# Patient Record
Sex: Female | Born: 1995 | Race: Black or African American | Hispanic: No | Marital: Single | Smoking: Never smoker
Health system: Southern US, Community
[De-identification: ages and names within clinical notes are randomized; demographics above are authoritative.]

## PROBLEM LIST (undated history)

## (undated) ENCOUNTER — Inpatient Hospital Stay (HOSPITAL_COMMUNITY): Payer: Self-pay

## (undated) DIAGNOSIS — L709 Acne, unspecified: Secondary | ICD-10-CM

## (undated) DIAGNOSIS — B951 Streptococcus, group B, as the cause of diseases classified elsewhere: Secondary | ICD-10-CM

## (undated) DIAGNOSIS — O99519 Diseases of the respiratory system complicating pregnancy, unspecified trimester: Secondary | ICD-10-CM

## (undated) DIAGNOSIS — N760 Acute vaginitis: Secondary | ICD-10-CM

## (undated) DIAGNOSIS — B9689 Other specified bacterial agents as the cause of diseases classified elsewhere: Secondary | ICD-10-CM

## (undated) DIAGNOSIS — O2340 Unspecified infection of urinary tract in pregnancy, unspecified trimester: Secondary | ICD-10-CM

## (undated) DIAGNOSIS — J45909 Unspecified asthma, uncomplicated: Secondary | ICD-10-CM

## (undated) DIAGNOSIS — M1990 Unspecified osteoarthritis, unspecified site: Secondary | ICD-10-CM

## (undated) HISTORY — DX: Acne, unspecified: L70.9

## (undated) HISTORY — DX: Other specified bacterial agents as the cause of diseases classified elsewhere: B96.89

## (undated) HISTORY — DX: Diseases of the respiratory system complicating pregnancy, unspecified trimester: O99.519

## (undated) HISTORY — PX: NO PAST SURGERIES: SHX2092

## (undated) HISTORY — DX: Unspecified infection of urinary tract in pregnancy, unspecified trimester: O23.40

## (undated) HISTORY — DX: Unspecified asthma, uncomplicated: J45.909

## (undated) HISTORY — DX: Acute vaginitis: N76.0

## (undated) HISTORY — DX: Streptococcus, group b, as the cause of diseases classified elsewhere: B95.1

---
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
## 2015-01-08 ENCOUNTER — Encounter (HOSPITAL_COMMUNITY): Payer: Self-pay | Admitting: Emergency Medicine

## 2015-01-08 ENCOUNTER — Emergency Department (HOSPITAL_COMMUNITY)
Admission: EM | Admit: 2015-01-08 | Discharge: 2015-01-08 | Disposition: A | Discharge status: Home / Self Care | Payer: 59 | Attending: Emergency Medicine | Admitting: Emergency Medicine

## 2015-01-08 DIAGNOSIS — R05 Cough: Secondary | ICD-10-CM | POA: Insufficient documentation

## 2015-01-08 DIAGNOSIS — R0789 Other chest pain: Secondary | ICD-10-CM | POA: Insufficient documentation

## 2015-01-08 DIAGNOSIS — Z79899 Other long term (current) drug therapy: Secondary | ICD-10-CM | POA: Insufficient documentation

## 2015-01-08 DIAGNOSIS — M546 Pain in thoracic spine: Secondary | ICD-10-CM | POA: Diagnosis not present

## 2015-01-08 DIAGNOSIS — J45909 Unspecified asthma, uncomplicated: Secondary | ICD-10-CM | POA: Insufficient documentation

## 2015-01-08 DIAGNOSIS — R059 Cough, unspecified: Secondary | ICD-10-CM

## 2015-01-08 MED ORDER — ALBUTEROL SULFATE (2.5 MG/3ML) 0.083% IN NEBU
5.0000 mg | INHALATION_SOLUTION | Freq: Once | RESPIRATORY_TRACT | Status: AC
  Administered 2015-01-08: 5 mg via RESPIRATORY_TRACT
  Filled 2015-01-08: qty 6

## 2015-01-08 MED ORDER — NAPROXEN 250 MG PO TABS
500.0000 mg | ORAL_TABLET | Freq: Once | ORAL | Status: AC
  Administered 2015-01-08: 500 mg via ORAL
  Filled 2015-01-08: qty 2

## 2015-01-08 MED ORDER — BENZONATATE 100 MG PO CAPS: 100.0000 mg | ORAL_CAPSULE | Freq: Three times a day (TID) | ORAL | Status: DC

## 2015-01-08 NOTE — ED Notes (Signed)
Pt. reports productive cough with chest congestion/tightness for 4 days unrelieved by OTC cough medications , denies fever /respirations unlabored.

## 2015-01-08 NOTE — Discharge Instructions (Signed)
Please read and follow all provided instructions.  Your diagnoses today include:  1. Cough     Tests performed today include:  Vital signs. See below for your results today.   Medications prescribed:   Tessalon Perles - cough suppressant medication  Take any prescribed medications only as directed.  Home care instructions:  Follow any educational materials contained in this packet.  Follow-up instructions: Please follow-up with your primary care provider in the next 3 days for further evaluation of your symptoms and a recheck if you are not feeling better.   Return instructions:   Please return to the Emergency Department if you experience worsening symptoms.  Please return with worsening wheezing, shortness of breath, or difficulty breathing.  Return with persistent fever above 101F.   Please return if you have any other emergent concerns.  Additional Information:  Your vital signs today were: BP 97/82 mmHg   Pulse 106   Temp(Src) 98.7 F (37.1 C) (Oral)   Resp 16   Ht 5\' 2"  (1.575 m)   Wt 48.988 kg   BMI 19.75 kg/m2   SpO2 97%   LMP 12/08/2014 (Approximate) If your blood pressure (BP) was elevated above 135/85 this visit, please have this repeated by your doctor within one month. --------------

## 2015-01-08 NOTE — ED Provider Notes (Signed)
CSN: [id number]     Arrival date & time 01/08/15  2028 History  By signing my name below, I, Elaine Stewart, attest that this documentation has been prepared under the direction and in the presence of non-physician practitioner, Rhea BleacherJosh Marquese Burkland PA-C. Electronically Signed: Freida Busmaniana Stewart, Scribe. 01/08/2015. 9:54 PM.     Chief Complaint  Patient presents with  . Cough   The history is provided by the patient. No language interpreter was used.     HPI Comments:  Elaine Stewart is a 20 y.o. female who presents to the Emergency Department complaining of persistent cough x 4 days that worsened yesterday with green sputum production. She reports associated chest tightness and diffuse chest and upper back pain. She denies fever and wheezing. Pt reports h/o asthma as a child; no exacerbations as an adult. She received albuterol for sx during pregnancy however. No alleviating factors noted. Patient denies risk factors for pulmonary embolism including: unilateral leg swelling, history of DVT/PE/other blood clots, use of exogenous hormones, recent immobilizations, recent surgery, recent travel (>4hr segment), malignancy, hemoptysis.      Past Medical History  Diagnosis Date  . Arthritis    Past Surgical History  Procedure Laterality Date  . No past surgeries     No family history on file. Social History  Substance Use Topics  . Smoking status: Never Smoker   . Smokeless tobacco: None  . Alcohol Use: No   OB History    Gravida Para Term Preterm AB TAB SAB Ectopic Multiple Living   1              Review of Systems  Constitutional: Negative for fever, chills and fatigue.  HENT: Negative for congestion, ear pain, rhinorrhea, sinus pressure and sore throat.   Eyes: Negative for redness.  Respiratory: Positive for cough and chest tightness. Negative for wheezing.   Cardiovascular: Positive for chest pain.  Gastrointestinal: Negative for nausea, vomiting, abdominal pain and diarrhea.  Genitourinary:  Negative for dysuria.  Musculoskeletal: Positive for back pain. Negative for myalgias and neck stiffness.  Skin: Negative for rash.  Neurological: Negative for headaches.  Hematological: Negative for adenopathy.   Allergies  Review of patient's allergies indicates no known allergies.  Home Medications   Prior to Admission medications   Medication Sig Start Date End Date Taking? Authorizing Provider  cephALEXin (KEFLEX) 500 MG capsule Take 1 capsule (500 mg total) by mouth 4 (four) times daily. Patient not taking: Reported on 07/04/2014 01/29/14   Delbert PhenixLinda M Barefoot, NP  fluconazole (DIFLUCAN) 150 MG tablet Take 1 tablet (150 mg total) by mouth once. Patient not taking: Reported on 07/04/2014 02/05/14   Adam PhenixJames G Arnold, MD  Prenatal Vit-Fe Fumarate-FA (PRENATAL MULTIVITAMIN) TABS tablet Take 1 tablet by mouth daily at 12 noon.    Historical Provider, MD   BP 97/82 mmHg  Pulse 106  Temp(Src) 98.7 F (37.1 C) (Oral)  Resp 16  Ht 5\' 2"  (1.575 m)  Wt 108 lb (48.988 kg)  BMI 19.75 kg/m2  SpO2 97%  LMP 12/08/2014 (Approximate)   Physical Exam  Constitutional: She appears well-developed and well-nourished. No distress.  HENT:  Head: Normocephalic and atraumatic.  Right Ear: Tympanic membrane, external ear and ear canal normal.  Left Ear: Tympanic membrane, external ear and ear canal normal.  Nose: Nose normal. No mucosal edema or rhinorrhea.  Mouth/Throat: Uvula is midline, oropharynx is clear and moist and mucous membranes are normal. Mucous membranes are not dry. No oral lesions. No trismus in  the jaw. No uvula swelling. No oropharyngeal exudate, posterior oropharyngeal edema, posterior oropharyngeal erythema or tonsillar abscesses.  Eyes: Conjunctivae are normal. Right eye exhibits no discharge. Left eye exhibits no discharge.  Neck: Normal range of motion. Neck supple.  Cardiovascular: Normal rate, regular rhythm and normal heart sounds.   Pulmonary/Chest: Effort normal and breath sounds  normal. No respiratory distress. She has no wheezes. She has no rales.  Frequent coughing during exam.  Abdominal: Soft. She exhibits no distension. There is no tenderness.  Lymphadenopathy:    She has no cervical adenopathy.  Neurological: She is alert.  Skin: Skin is warm and dry.  Psychiatric: She has a normal mood and affect.  Nursing note and vitals reviewed.   ED Course  Procedures   DIAGNOSTIC STUDIES:  Oxygen Saturation is 97% on RA, normal by my interpretation.    COORDINATION OF CARE:  9:19 PM Will order breathing treatment to assess effectiveness of albuterol. Discussed treatment plan with pt at bedside and pt agreed to plan.  Vital signs reviewed and are as follows: Filed Vitals:   01/08/15 2040  BP: 97/82  Pulse: 106  Temp: 98.7 F (37.1 C)  Resp: 16   10:24 PM Patient without much subjective improvement after breathing treatment, however she is in no distress. Naproxen given for generalized body pain. Will d/c to home with tessalon.   Encouraged to rest and drink plenty of fluids.  Patient told to return to ED or see their primary doctor if their symptoms worsen, high fever not controlled, persistent vomiting, worsening pain, shortness of breath, trouble breathing, increased work of breathing.  Patient verbalized understanding and agreed with plan.     MDM   Final diagnoses:  Cough   Patient with cough. She has generalized chest pain consistent with muscle soreness from frequent coughing. No concerning risk factors or history limits concerning for pneumonia or PE. Do not feel that evaluation with chest x-ray or lab work is indicated at this time. Patient is not hypoxic and is in no distress. Discharge to home with conservative measures, return instructions as above.  I personally performed the services described in this documentation, which was scribed in my presence. The recorded information has been reviewed and is accurate.    Renne Crigler,  PA-C 01/08/15 2229  Zadie Rhine, MD 01/09/15 1311

## 2015-07-27 ENCOUNTER — Inpatient Hospital Stay (HOSPITAL_COMMUNITY)
Admission: AD | Admit: 2015-07-27 | Discharge: 2015-07-27 | Disposition: A | Discharge status: Home / Self Care | Payer: 59 | Source: Ambulatory Visit | Attending: Family Medicine | Admitting: Family Medicine

## 2015-07-27 DIAGNOSIS — M25511 Pain in right shoulder: Secondary | ICD-10-CM | POA: Insufficient documentation

## 2015-07-27 NOTE — Discharge Instructions (Signed)
Heat Therapy °Heat therapy can help ease sore, stiff, injured, and tight muscles and joints. Heat relaxes your muscles, which may help ease your pain.  °RISKS AND COMPLICATIONS °If you have any of the following conditions, do not use heat therapy unless your health care provider has approved: °· Poor circulation. °· Healing wounds or scarred skin in the area being treated. °· Diabetes, heart disease, or high blood pressure. °· Not being able to feel (numbness) the area being treated. °· Unusual swelling of the area being treated. °· Active infections. °· Blood clots. °· Cancer. °· Inability to communicate pain. This may include young children and people who have problems with their brain function (dementia). °· Pregnancy. °Heat therapy should only be used on old, pre-existing, or long-lasting (chronic) injuries. Do not use heat therapy on new injuries unless directed by your health care provider. °HOW TO USE HEAT THERAPY °There are several different kinds of heat therapy, including: °· Moist heat pack. °· Warm water bath. °· Hot water bottle. °· Electric heating pad. °· Heated gel pack. °· Heated wrap. °· Electric heating pad. °Use the heat therapy method suggested by your health care provider. Follow your health care provider's instructions on when and how to use heat therapy. °GENERAL HEAT THERAPY RECOMMENDATIONS °· Do not sleep while using heat therapy. Only use heat therapy while you are awake. °· Your skin may turn pink while using heat therapy. Do not use heat therapy if your skin turns red. °· Do not use heat therapy if you have new pain. °· High heat or long exposure to heat can cause burns. Be careful when using heat therapy to avoid burning your skin. °· Do not use heat therapy on areas of your skin that are already irritated, such as with a rash or sunburn. °SEEK MEDICAL CARE IF: °· You have blisters, redness, swelling, or numbness. °· You have new pain. °· Your pain is worse. °MAKE SURE  YOU: °· Understand these instructions. °· Will watch your condition. °· Will get help right away if you are not doing well or get worse. °  °This information is not intended to replace advice given to you by your health care provider. Make sure you discuss any questions you have with your health care provider. °  °Document Released: 03/14/2011 Document Revised: 01/10/2014 Document Reviewed: 02/12/2013 °Elsevier Interactive Patient Education ©2016 [employer] ° °Joint Pain °Joint pain, which is also called arthralgia, can be caused by many things. Joint pain often goes away when you follow your health care provider's instructions for relieving pain at home. However, joint pain can also be caused by conditions that require further treatment. Common causes of joint pain include: °· Bruising in the area of the joint. °· Overuse of the joint. °· Wear and tear on the joints that occur with aging (osteoarthritis). °· Various other forms of arthritis. °· A buildup of a crystal form of uric acid in the joint (gout). °· Infections of the joint (septic arthritis) or of the bone (osteomyelitis). °Your health care provider may recommend medicine to help with the pain. If your joint pain continues, additional tests may be needed to diagnose your condition. °HOME CARE INSTRUCTIONS °Watch your condition for any changes. Follow these instructions as directed to lessen the pain that you are feeling. °· Take medicines only as directed by your health care provider. °· Rest the affected area for as long as your health care provider says that you should. If directed to do so, raise   the painful joint above the level of your heart while you are sitting or lying down. °· Do not do things that cause or worsen pain. °· If directed, apply ice to the painful area: °¨ Put ice in a plastic bag. °¨ Place a towel between your skin and the bag. °¨ Leave the ice on for 20 minutes, 2-3 times per day. °· Wear an elastic bandage, splint, or sling as  directed by your health care provider. Loosen the elastic bandage or splint if your fingers or toes become numb and tingle, or if they turn cold and blue. °· Begin exercising or stretching the affected area as directed by your health care provider. Ask your health care provider what types of exercise are safe for you. °· Keep all follow-up visits as directed by your health care provider. This is important. °SEEK MEDICAL CARE IF: °· Your pain increases, and medicine does not help. °· Your joint pain does not improve within 3 days. °· You have increased bruising or swelling. °· You have a fever. °· You lose 10 lb (4.5 kg) or more without trying. °SEEK IMMEDIATE MEDICAL CARE IF: °· You are not able to move the joint. °· Your fingers or toes become numb or they turn cold and blue. °  °This information is not intended to replace advice given to you by your health care provider. Make sure you discuss any questions you have with your health care provider. °  °Document Released: 12/20/2004 Document Revised: 01/10/2014 Document Reviewed: 10/01/2013 °Elsevier Interactive Patient Education ©2016 [employer] ° °

## 2015-07-27 NOTE — MAU Note (Signed)
Pt reports she dislocated her right shoulder yesterday and she thinks she got it back in place by herself and reports it has hurt since then and it happened again today. Has not been evaluated for this.

## 2015-07-27 NOTE — MAU Provider Note (Signed)
S:  [name] is a 20 y.o. non- pregnant female here with a possible dislocated right shoulder. Yesterday she heard a pop and she was able to get it back into place, she felt the same pop again today. Her right shoulder is not currently dislocated, however she is worried that it will pop out again. No known recent injury to this area. She came to [facility] for this problem due to wait times of other ED's in the area.   She has tried ice and ibuprofen with only minimal relief.    O:  Vitals:   07/27/15 1931  BP: 109/68  Pulse: 94  Resp: 16  Temp: 98.9 F (37.2 C)  TempSrc: Oral  SpO2: 100%    GENERAL: Well-developed, well-nourished female in no acute distress.  LUNGS: Effort normal SKIN: Warm, dry and without erythema PSYCH: Normal mood and affect MUSCULOSKELETAL: limited range of motion due to pain with right arm and shoulder movement. Patient able to supinate and pronate right hand.  Dislocation not noted at this time. Popping sound not noted with movement.    A:  1. Pain in joint of right shoulder     P:  Discharge in stable condition Discussed the importance of establishing a PCP for further investigation of possible non-emergent shoulder injury. Patient may need outpatient MRI/X-ray. Discussed proper use of MAU. Offer the patient flexeril- patient declined. Recommended heat/ice to affected area.  Patient plans to call PCP in the AM.  Patient encouraged to go to [facility] ED, [facility] ED or urgent care if symptoms worsen  Duane Lope, NP 07/27/2015 11:29 PM

## 2015-07-28 ENCOUNTER — Ambulatory Visit (INDEPENDENT_AMBULATORY_CARE_PROVIDER_SITE_OTHER): Payer: 59

## 2015-07-28 ENCOUNTER — Encounter: Payer: Self-pay | Admitting: Physician Assistant

## 2015-07-28 ENCOUNTER — Ambulatory Visit (INDEPENDENT_AMBULATORY_CARE_PROVIDER_SITE_OTHER): Payer: 59 | Admitting: Physician Assistant

## 2015-07-28 VITALS — BP 118/74 | HR 100 | Temp 98.5°F | Resp 18 | Ht 63.0 in | Wt 108.0 lb

## 2015-07-28 DIAGNOSIS — S43004A Unspecified dislocation of right shoulder joint, initial encounter: Secondary | ICD-10-CM | POA: Diagnosis not present

## 2015-07-28 DIAGNOSIS — M25511 Pain in right shoulder: Secondary | ICD-10-CM

## 2015-07-28 MED ORDER — CYCLOBENZAPRINE HCL 10 MG PO TABS: 10.0000 mg | ORAL_TABLET | Freq: Three times a day (TID) | ORAL | 0 refills | Status: DC | PRN

## 2015-07-28 MED ORDER — MELOXICAM 15 MG PO TABS: 15.0000 mg | ORAL_TABLET | Freq: Every day | ORAL | 1 refills | Status: DC

## 2015-07-28 NOTE — Patient Instructions (Addendum)
The muscle relaxer can cause drowsiness. Please use it with caution-possibly reserving it for bedtime.    IF you received an x-ray today, you will receive an invoice from [facility]. Please contact [facility] at [phone] with questions or concerns regarding your invoice.   IF you received labwork today, you will receive an invoice from [employer]. Please contact [facility] at [phone] with questions or concerns regarding your invoice.   Our billing staff will not be able to assist you with questions regarding bills from these companies.  You will be contacted with the lab results as soon as they are available. The fastest way to get your results is to activate your My Chart account. Instructions are located on the last page of this paperwork. If you have not heard from [place] regarding the results in 2 weeks, please contact this office.
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
## 2015-08-15 ENCOUNTER — Ambulatory Visit (HOSPITAL_COMMUNITY)
Admission: EM | Admit: 2015-08-15 | Discharge: 2015-08-15 | Disposition: A | Discharge status: Nursing Home (Includes ICF) | Payer: 59 | Attending: Family Medicine | Admitting: Family Medicine

## 2015-08-15 ENCOUNTER — Emergency Department (HOSPITAL_COMMUNITY)
Admission: EM | Admit: 2015-08-15 | Discharge: 2015-08-16 | Disposition: A | Discharge status: Home / Self Care | Payer: 59 | Attending: Emergency Medicine | Admitting: Emergency Medicine

## 2015-08-15 ENCOUNTER — Encounter (HOSPITAL_COMMUNITY): Payer: Self-pay

## 2015-08-15 DIAGNOSIS — M6200 Separation of muscle (nontraumatic), unspecified site: Secondary | ICD-10-CM | POA: Diagnosis not present

## 2015-08-15 DIAGNOSIS — R1030 Lower abdominal pain, unspecified: Secondary | ICD-10-CM

## 2015-08-15 DIAGNOSIS — J069 Acute upper respiratory infection, unspecified: Secondary | ICD-10-CM

## 2015-08-15 DIAGNOSIS — J45909 Unspecified asthma, uncomplicated: Secondary | ICD-10-CM | POA: Insufficient documentation

## 2015-08-15 DIAGNOSIS — R1084 Generalized abdominal pain: Secondary | ICD-10-CM | POA: Diagnosis not present

## 2015-08-15 DIAGNOSIS — B349 Viral infection, unspecified: Secondary | ICD-10-CM | POA: Insufficient documentation

## 2015-08-15 DIAGNOSIS — M6208 Separation of muscle (nontraumatic), other site: Secondary | ICD-10-CM

## 2015-08-15 DIAGNOSIS — R109 Unspecified abdominal pain: Secondary | ICD-10-CM | POA: Diagnosis present

## 2015-08-15 LAB — URINALYSIS, ROUTINE W REFLEX MICROSCOPIC
BILIRUBIN URINE: NEGATIVE
Glucose, UA: NEGATIVE mg/dL
HGB URINE DIPSTICK: NEGATIVE
KETONES UR: 15 mg/dL — AB
Leukocytes, UA: NEGATIVE
Nitrite: NEGATIVE
PH: 6 (ref 5.0–8.0)
Protein, ur: NEGATIVE mg/dL
Specific Gravity, Urine: 1.029 (ref 1.005–1.030)

## 2015-08-15 LAB — COMPREHENSIVE METABOLIC PANEL
ALBUMIN: 4 g/dL (ref 3.5–5.0)
ALT: 13 U/L — ABNORMAL LOW (ref 14–54)
AST: 18 U/L (ref 15–41)
Alkaline Phosphatase: 55 U/L (ref 38–126)
Anion gap: 9 (ref 5–15)
BUN: 9 mg/dL (ref 6–20)
CO2: 25 mmol/L (ref 22–32)
Calcium: 8.9 mg/dL (ref 8.9–10.3)
Chloride: 102 mmol/L (ref 101–111)
Creatinine, Ser: 0.71 mg/dL (ref 0.44–1.00)
GFR calc Af Amer: >60 mL/min (ref >60)
GFR calc non Af Amer: >60 mL/min (ref >60)
GLUCOSE: 118 mg/dL — AB (ref 65–99)
POTASSIUM: 3.7 mmol/L (ref 3.5–5.1)
Sodium: 136 mmol/L (ref 135–145)
Total Bilirubin: 0.1 mg/dL — ABNORMAL LOW (ref 0.3–1.2)
Total Protein: 7.2 g/dL (ref 6.5–8.1)

## 2015-08-15 LAB — CBC
HEMATOCRIT: 39.3 % (ref 36.0–46.0)
Hemoglobin: 13.2 g/dL (ref 12.0–15.0)
MCH: 29.2 pg (ref 26.0–34.0)
MCHC: 33.6 g/dL (ref 30.0–36.0)
MCV: 86.9 fL (ref 78.0–100.0)
Platelets: 342 10*3/uL (ref 150–400)
RBC: 4.52 MIL/uL (ref 3.87–5.11)
RDW: 12.9 % (ref 11.5–15.5)
WBC: 4.7 10*3/uL (ref 4.0–10.5)

## 2015-08-15 LAB — LIPASE, BLOOD: LIPASE: 36 U/L (ref 11–51)

## 2015-08-15 LAB — I-STAT BETA HCG BLOOD, ED (MC, WL, AP ONLY): I-stat hCG, quantitative: <5 m[IU]/mL (ref <5)

## 2015-08-15 LAB — POCT RAPID STREP A: Streptococcus, Group A Screen (Direct): NEGATIVE

## 2015-08-15 NOTE — ED Provider Notes (Signed)
[facility]    CSN: [id number] Arrival date & time: 08/15/15  1746  First Provider Contact:  First MD Initiated Contact with Patient 08/15/15 2012        History   Chief Complaint Chief Complaint  Patient presents with  . Nasal Congestion  . Sore Throat  . Abdominal Pain    HPI Elaine Stewart is a 20 y.o. female.   HPI Patient presents this evening with the above complaint.  States that sore throat, nasal congestion started a few days ago.  Has had a cough but nonproductive.  Denies fever, but admits some chills.  Has a one year old son that was sick recently but is getting better.  He does attend a daycare.  abd pain started today and localizes pain to the left lower quadrant to the umbilical area. States that pain is worsening and more comfortable lying in the fetal position. No change in bowel habits.  Denies dysuria.  No nausea, vomiting.   Past Medical History:  Diagnosis Date  . Acne 09/27/2010  . Arthritis   . Asthma affecting pregnancy, antepartum 05/07/2014  . BV (bacterial vaginosis) 01/13/2014  . GBS (group B streptococcus) UTI complicating pregnancy 01/13/2014    Patient Active Problem List   Diagnosis Date Noted  . 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 Rhinitis, allergic 09/29/2010  . Hypertrophy of tonsils and adenoids 09/27/2010    Past Surgical History:  Procedure Laterality Date  . NO PAST SURGERIES      OB History    Gravida Para Term Preterm AB Living   1             SAB TAB Ectopic Multiple Live Births                   Home Medications    Prior to Admission medications   Medication Sig Start Date End Date Taking? Authorizing Provider  cyclobenzaprine (FLEXERIL) 10 MG tablet Take 1 tablet (10 mg total) by mouth 3 (three) times daily as needed for muscle spasms. 07/28/15   Chelle Jeffery, PA-C  meloxicam (MOBIC) 15 MG tablet Take 1 tablet (15 mg total) by mouth daily. 07/28/15   Porfirio Oarhelle Jeffery, PA-C     Family History History reviewed. No pertinent family history.  Social History Social History  Substance Use Topics  . Smoking status: Never Smoker  . Smokeless tobacco: Never Used  . Alcohol use No     Allergies   Review of patient's allergies indicates no known allergies.   Review of Systems Review of Systems  Constitutional: Negative.   HENT: Positive for congestion, postnasal drip, sinus pressure and sore throat. Negative for facial swelling, hearing loss and mouth sores.   Respiratory: Positive for cough and shortness of breath.   Cardiovascular: Negative.   Gastrointestinal: Positive for abdominal pain. Negative for anal bleeding, blood in stool, constipation, diarrhea, nausea and vomiting.  Genitourinary: Negative.   Musculoskeletal: Negative.   Neurological: Negative.   Psychiatric/Behavioral: Negative.      Physical Exam Triage Vital Signs ED Triage Vitals [08/15/15 1912]  Enc Vitals Group     BP 119/75     Pulse Rate 90     Resp      Temp 98.2 F (36.8 C)     Temp Source Oral     SpO2 100 %     Weight      Height      Head Circumference  Peak Flow      Pain Score 10     Pain Loc      Pain Edu?      Excl. in GC?    No data found.   Updated Vital Signs BP 119/75 (BP Location: Left Arm)   Pulse 90   Temp 98.2 F (36.8 C) (Oral)   LMP 07/30/2015 (Within Days)   SpO2 100%   Visual Acuity Right Eye Distance:   Left Eye Distance:   Bilateral Distance:    Right Eye Near:   Left Eye Near:    Bilateral Near:     Physical Exam  Constitutional: She is oriented to person, place, and time. She appears well-developed. She appears distressed (appears to be somewhat uncomfortable due to abd discomfort. ).  HENT:  Head: Normocephalic and atraumatic.  Nose: Nose normal.  Eyes: EOM are normal. Pupils are equal, round, and reactive to light.  Neck: Normal range of motion.  Cardiovascular: Normal rate.   Pulmonary/Chest: Effort normal and  breath sounds normal. No respiratory distress.  Abdominal: Bowel sounds are normal. There is tenderness. There is guarding. A hernia (question umbilical ) is present.  Musculoskeletal: Normal range of motion.  Neurological: She is alert and oriented to person, place, and time.  Skin: Skin is warm and dry.  Psychiatric: She has a normal mood and affect.     UC Treatments / Results  Labs (all labs ordered are listed, but only abnormal results are displayed) Labs Reviewed  POCT RAPID STREP A    EKG  EKG Interpretation None       Radiology No results found.  Procedures Procedures (including critical care time)  Medications Ordered in UC Medications - No data to display   Initial Impression / Assessment and Plan / UC Course  I have reviewed the triage vital signs and the nursing notes.  Pertinent labs & imaging results that were available during my care of the patient were reviewed by me and considered in my medical decision making (see chart for details).  Clinical Course      Final Clinical Impressions(s) / UC Diagnoses   Final diagnoses:  URI (upper respiratory infection)  Lower abdominal pain    New Prescriptions New Prescriptions   No medications on file  with the abd pain that patient is having we recommend that she be evaluated in the ED to see if any imaging studies are needed.  Discussed plan with patient and she voices understanding.    Naida Sleight, PA-C 08/15/15 2205

## 2015-08-15 NOTE — ED Triage Notes (Signed)
[facility] of nasal congestion, sore throat, and abdominal pain x4 days, pt has taken robitussin to treat symptoms, pt states symptoms have worsen No acute distress

## 2015-08-15 NOTE — ED Triage Notes (Signed)
Patient sent from [facility] for further evaluation of left sided umbilical pain. MD concerned that patient may have hernia. Tender to palpation. No trauma, no nausea

## 2015-08-15 NOTE — ED Notes (Signed)
Report given to First Nurse Thayer Ohmhris

## 2015-08-15 NOTE — ED Notes (Signed)
Called x1. No answer.

## 2015-08-16 ENCOUNTER — Emergency Department (HOSPITAL_COMMUNITY): Payer: 59

## 2015-08-16 ENCOUNTER — Encounter (HOSPITAL_COMMUNITY): Payer: Self-pay | Admitting: Radiology

## 2015-08-16 MED ORDER — PSEUDOEPHEDRINE HCL 60 MG PO TABS: 60.0000 mg | ORAL_TABLET | ORAL | 0 refills | Status: DC | PRN

## 2015-08-16 MED ORDER — NAPROXEN 500 MG PO TABS: 500.0000 mg | ORAL_TABLET | Freq: Two times a day (BID) | ORAL | 0 refills | Status: DC

## 2015-08-16 MED ORDER — BENZONATATE 100 MG PO CAPS: 100.0000 mg | ORAL_CAPSULE | Freq: Three times a day (TID) | ORAL | 0 refills | Status: DC

## 2015-08-16 MED ORDER — IOPAMIDOL (ISOVUE-300) INJECTION 61%
INTRAVENOUS | Status: AC
  Administered 2015-08-16: 100 mL
  Filled 2015-08-16: qty 100

## 2015-08-16 MED ORDER — PROMETHAZINE HCL 25 MG/ML IJ SOLN
12.5000 mg | Freq: Once | INTRAMUSCULAR | Status: AC
  Administered 2015-08-16: 12.5 mg via INTRAVENOUS
  Filled 2015-08-16: qty 1

## 2015-08-16 MED ORDER — ONDANSETRON HCL 4 MG/2ML IJ SOLN
INTRAMUSCULAR | Status: AC
  Filled 2015-08-16: qty 2

## 2015-08-16 MED ORDER — DIATRIZOATE MEGLUMINE & SODIUM 66-10 % PO SOLN
ORAL | Status: AC
  Filled 2015-08-16: qty 30

## 2015-08-16 NOTE — Discharge Instructions (Signed)
Please read and follow all provided instructions.  Your diagnoses today include:  1. Generalized abdominal pain   2. Viral syndrome   3. Diastasis recti     Tests performed today include:  Blood counts and electrolytes  Blood tests to check liver and kidney function  Blood tests to check pancreas function  Urine test to look for infection and pregnancy (in women)  CT scan - does not show any hernias or other problems  Vital signs. See below for your results today.   Medications prescribed:   Naproxen - anti-inflammatory pain medication  Do not exceed 500mg  naproxen every 12 hours, take with food  You have been prescribed an anti-inflammatory medication or NSAID. Take with food. Take smallest effective dose for the shortest duration needed for your pain. Stop taking if you experience stomach pain or vomiting.    Tessalon Perles - cough suppressant medication   Pseudoephedrine - decongestant medication to help with nasal congestion  Take any prescribed medications only as directed.  Home care instructions:   Follow any educational materials contained in this packet.  Follow-up instructions: Please follow-up with your primary care provider in the next 3 days for further evaluation of your symptoms.    Return instructions:  SEEK IMMEDIATE MEDICAL ATTENTION IF:  The pain does not go away or becomes severe   A temperature above 101F develops   Repeated vomiting occurs (multiple episodes)   The pain becomes localized to portions of the abdomen. The right side could possibly be appendicitis. In an adult, the left lower portion of the abdomen could be colitis or diverticulitis.   Blood is being passed in stools or vomit (bright red or black tarry stools)   You develop chest pain, difficulty breathing, dizziness or fainting, or become confused, poorly responsive, or inconsolable (young children)  If you have any other emergent concerns regarding your  health  Additional Information: Abdominal (belly) pain can be caused by many things. Your caregiver performed an examination and possibly ordered blood/urine tests and imaging (CT scan, x-rays, ultrasound). Many cases can be observed and treated at home after initial evaluation in the emergency department. Even though you are being discharged home, abdominal pain can be unpredictable. Therefore, you need a repeated exam if your pain does not resolve, returns, or worsens. Most patients with abdominal pain don't have to be admitted to the hospital or have surgery, but serious problems like appendicitis and gallbladder attacks can start out as nonspecific pain. Many abdominal conditions cannot be diagnosed in one visit, so follow-up evaluations are very important.  Your vital signs today were: BP 100/63    Pulse 74    Temp 98.1 F (36.7 C) (Oral)    Resp 16    Ht 5\' 2"  (1.575 m)    Wt 51 kg    LMP 07/30/2015 (Within Days)    SpO2 99%    BMI 20.55 kg/m  If your blood pressure (bp) was elevated above 135/85 this visit, please have this repeated by your doctor within one month. --------------

## 2015-08-16 NOTE — ED Provider Notes (Signed)
[facility] Provider Note   CSN: [id number] Arrival date & time: 08/15/15  2106       History   Chief Complaint Chief Complaint  Patient presents with  . Abdominal Pain    HPI Elaine Stewart is a 20 y.o. female.  Patient with questionable history of ventral hernia -- sent from [facility] with abdominal pain. Patient has had intermittent L sided abd pain starting today. Pain does not radiate. It is worse with palpation. No N/V/D. No urinary or vaginal complaints. No treatments PTA. She states she had trouble with a 'golfball sized' area on abdomen after she had her baby.   Went to [facility] originally for sore throat (neg rapid strep), cough, nasal congestion.       Past Medical History:  Diagnosis Date  . Acne 09/27/2010  . Arthritis   . Asthma affecting pregnancy, antepartum 05/07/2014  . BV (bacterial vaginosis) 01/13/2014  . GBS (group B streptococcus) UTI complicating pregnancy 01/13/2014    Patient Active Problem List   Diagnosis Date Noted  . 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 Rhinitis, allergic 09/29/2010  . Hypertrophy of tonsils and adenoids 09/27/2010    Past Surgical History:  Procedure Laterality Date  . NO PAST SURGERIES      OB History    Gravida Para Term Preterm AB Living   1             SAB TAB Ectopic Multiple Live Births                   Home Medications    Prior to Admission medications   Medication Sig Start Date End Date Taking? Authorizing Provider  cyclobenzaprine (FLEXERIL) 10 MG tablet Take 1 tablet (10 mg total) by mouth 3 (three) times daily as needed for muscle spasms. 07/28/15   Chelle Jeffery, PA-C  meloxicam (MOBIC) 15 MG tablet Take 1 tablet (15 mg total) by mouth daily. 07/28/15   Porfirio Oar, PA-C    Family History No family history on file.  Social History Social History  Substance Use Topics  . Smoking status: Never Smoker  . Smokeless tobacco: Never Used  . Alcohol use No      Allergies   Review of patient's allergies indicates no known allergies.   Review of Systems Review of Systems  Constitutional: Negative for fever.  HENT: Negative for rhinorrhea and sore throat.   Eyes: Negative for redness.  Respiratory: Negative for cough.   Cardiovascular: Negative for chest pain.  Gastrointestinal: Positive for abdominal pain. Negative for diarrhea, nausea and vomiting.  Genitourinary: Negative for dysuria, vaginal bleeding and vaginal discharge.  Musculoskeletal: Negative for myalgias.  Skin: Negative for rash.  Neurological: Negative for headaches.     Physical Exam Updated Vital Signs BP 124/86 (BP Location: Right Arm)   Pulse 88   Temp 98.1 F (36.7 C) (Oral)   Resp 16   Ht  (1.575 m)   Wt 51 kg   LMP 07/30/2015 (Within Days)   SpO2 100%   BMI 20.55 kg/m   Physical Exam  Constitutional: She appears well-developed and well-nourished.  HENT:  Head: Normocephalic and atraumatic.  Eyes: Conjunctivae are normal. Right eye exhibits no discharge. Left eye exhibits no discharge.  Neck: Normal range of motion. Neck supple.  Cardiovascular: Normal rate, regular rhythm and normal heart sounds.   Pulmonary/Chest: Effort normal and breath sounds normal.  Abdominal: Soft. She exhibits no mass. There is tenderness (moderate tenderness). There is  no rebound and no guarding. No hernia.  + Diastasis recti  Neurological: She is alert.  Skin: Skin is warm and dry.  Psychiatric: She has a normal mood and affect.  Nursing note and vitals reviewed.    ED Treatments / Results  Labs (all labs ordered are listed, but only abnormal results are displayed) Labs Reviewed  COMPREHENSIVE METABOLIC PANEL - Abnormal; Notable for the following:       Result Value   Glucose, Bld 118 (*)    ALT 13 (*)    Total Bilirubin 0.1 (*)    All other components within normal limits  URINALYSIS, ROUTINE W REFLEX MICROSCOPIC (NOT AT [facility]) - Abnormal; Notable for the  following:    Ketones, ur 15 (*)    All other components within normal limits  CULTURE, GROUP A STREP ([facility])  CBC  LIPASE, BLOOD  I-STAT BETA HCG BLOOD, ED ([facility], [facility], [facility] ONLY)     Radiology Ct Abdomen Pelvis W Contrast  Result Date: 08/16/2015 CLINICAL DATA:  Acute onset of left-sided umbilical pain and tenderness. Initial encounter. EXAM: CT ABDOMEN AND PELVIS WITH CONTRAST TECHNIQUE: Multidetector CT imaging of the abdomen and pelvis was performed using the standard protocol following bolus administration of intravenous contrast. CONTRAST:  ISOVUE-300 IOPAMIDOL (ISOVUE-300) INJECTION 61% COMPARISON:  None. FINDINGS: The visualized lung bases are clear. The liver and spleen are unremarkable in appearance. The gallbladder is within normal limits. The pancreas and adrenal glands are unremarkable. The kidneys demonstrate incomplete rotation, but are otherwise grossly unremarkable. There is no evidence of hydronephrosis. No renal or ureteral stones are seen. No perinephric stranding is appreciated. The small bowel is unremarkable in appearance. The stomach is within normal limits. No acute vascular abnormalities are seen. The appendix is normal in caliber, without evidence of appendicitis. The colon is grossly unremarkable in appearance. The bladder is mildly distended and grossly unremarkable. The uterus is unremarkable in appearance. The ovaries are grossly symmetric. No suspicious adnexal masses are seen. Trace free fluid within the pelvis is likely physiologic in nature. No inguinal lymphadenopathy is seen. No acute osseous abnormalities are identified. IMPRESSION: Unremarkable contrast-enhanced CT of the abdomen and pelvis. Electronically Signed   By: Roanna Raider M.D.   On: 08/16/2015 05:53    Procedures Procedures (including critical care time)  Medications Ordered in ED Medications  diatrizoate meglumine-sodium (GASTROGRAFIN) 66-10 % solution (  Canceled Entry 08/16/15 0554)    ondansetron (ZOFRAN) 4 MG/2ML injection (  Canceled Entry 08/16/15 0555)  promethazine (PHENERGAN) injection 12.5 mg (12.5 mg Intravenous Given 08/16/15 0338)  iopamidol (ISOVUE-300) 61 % injection (100 mLs  Contrast Given 08/16/15 0409)     Initial Impression / Assessment and Plan / ED Course  I have reviewed the triage vital signs and the nursing notes.  Pertinent labs & imaging results that were available during my care of the patient were reviewed by me and considered in my medical decision making (see chart for details).  Clinical Course  Comment By Time  Pt seen and examined. Discussed lab work-up is reassuring. Patient has significant L sided abd tenderness. Discussed CT imaging vs monitoring at home with outpatient follow-up. Pt would like CT to rule out any significant pathology. No definite ventral hernia on exam.  Renne Crigler, PA-C 08/13 0053  CT results discussed with patient and family in room. Will discharge to home at this time. Will give symptomatic treatments for her viral infection.  The patient was urged to return to the Emergency  Department immediately with worsening of current symptoms, worsening abdominal pain, persistent vomiting, blood noted in stools, fever, or any other concerns. The patient verbalized understanding.  Renne CriglerJoshua Breckon Reeves, PA-C 08/13 [id number]     Final Clinical Impressions(s) / ED Diagnoses   Final diagnoses:  Generalized abdominal pain  Viral syndrome  Diastasis recti   Abdominal pain: Worrisome L-sided Abdominal pain despite normal labs. CT is negative. Symptomatic treatment indicated at this time.  Viral syndrome: Symptomatically treatment. Patient nontoxic, afebrile.  New Prescriptions Discharge Medication List as of 08/16/2015  6:43 AM    START taking these medications   Details  benzonatate (TESSALON) 100 MG capsule Take 1 capsule (100 mg total) by mouth every 8 (eight) hours., Starting Sun 08/16/2015, Print    naproxen (NAPROSYN) 500 MG  tablet Take 1 tablet (500 mg total) by mouth 2 (two) times daily., Starting Sun 08/16/2015, Print    pseudoephedrine (SUDAFED) 60 MG tablet Take 1 tablet (60 mg total) by mouth every 4 (four) hours as needed for congestion., Starting Sun 08/16/2015, Print         Renne CriglerJoshua Leather Estis, PA-C 08/16/15 [id number]    Tomasita CrumbleAdeleke Oni, MD 08/16/15 1721

## 2015-08-18 LAB — CULTURE, GROUP A STREP (THRC)

## 2015-09-08 ENCOUNTER — Encounter (HOSPITAL_COMMUNITY): Payer: Self-pay | Admitting: Emergency Medicine

## 2015-09-08 DIAGNOSIS — Y990 Civilian activity done for income or pay: Secondary | ICD-10-CM | POA: Diagnosis not present

## 2015-09-08 DIAGNOSIS — W228XXA Striking against or struck by other objects, initial encounter: Secondary | ICD-10-CM | POA: Diagnosis not present

## 2015-09-08 DIAGNOSIS — Y939 Activity, unspecified: Secondary | ICD-10-CM | POA: Insufficient documentation

## 2015-09-08 DIAGNOSIS — S0990XA Unspecified injury of head, initial encounter: Secondary | ICD-10-CM | POA: Diagnosis present

## 2015-09-08 DIAGNOSIS — S060X0A Concussion without loss of consciousness, initial encounter: Secondary | ICD-10-CM | POA: Diagnosis not present

## 2015-09-08 DIAGNOSIS — Y929 Unspecified place or not applicable: Secondary | ICD-10-CM | POA: Insufficient documentation

## 2015-09-08 NOTE — ED Notes (Signed)
Dr. Patria Maneampos ( EDP ) consulted on pt.'s condition at triage , no new order received .

## 2015-09-08 NOTE — ED Triage Notes (Addendum)
Pt. accidentally hit her head against a freezer door at work last night , denies LOC / alert and oriented , reports headache with lightheadedness/mild blurred vision . Ambulatory /respirations unlabored .

## 2015-09-09 ENCOUNTER — Emergency Department (HOSPITAL_COMMUNITY)
Admission: EM | Admit: 2015-09-09 | Discharge: 2015-09-09 | Disposition: A | Discharge status: Home / Self Care | Payer: 59 | Attending: Emergency Medicine | Admitting: Emergency Medicine

## 2015-09-09 DIAGNOSIS — S060X0A Concussion without loss of consciousness, initial encounter: Secondary | ICD-10-CM

## 2015-09-09 DIAGNOSIS — S0990XA Unspecified injury of head, initial encounter: Secondary | ICD-10-CM

## 2015-09-09 MED ORDER — IBUPROFEN 800 MG PO TABS
800.0000 mg | ORAL_TABLET | Freq: Once | ORAL | Status: AC
  Administered 2015-09-09: 800 mg via ORAL
  Filled 2015-09-09: qty 1

## 2015-09-09 MED ORDER — ONDANSETRON 4 MG PO TBDP: 4.0000 mg | ORAL_TABLET | Freq: Three times a day (TID) | ORAL | 0 refills | Status: DC | PRN

## 2015-09-09 MED ORDER — IBUPROFEN 800 MG PO TABS: 800.0000 mg | ORAL_TABLET | Freq: Three times a day (TID) | ORAL | 0 refills | Status: DC | PRN

## 2015-09-09 MED ORDER — ONDANSETRON 4 MG PO TBDP
4.0000 mg | ORAL_TABLET | Freq: Once | ORAL | Status: DC
  Filled 2015-09-09: qty 1
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
## 2015-09-09 NOTE — ED Notes (Signed)
Pt provided with d/c instructions at this time. Pt verbalizes understanding of d/c instructions as well as follow up procedure after d/c.  Pt provided with RX for ibuprofen and zofran.  Pt verbalizes understanding of RX directions. Pt in no apparent distress at this time. Pt ambulatory at time of d/c.

## 2015-09-09 NOTE — Discharge Instructions (Signed)
You may alternate Tylenol 1000 mg every 6 hours as needed for pain and ibuprofen 800 mg every 8 hours as needed for pain. Please avoid any activity that may lead to another head injury for at least 1 week arterial symptoms have completely resolved. Please avoid television, cell phones, tablets, computers for the next 2-3 days. Please increase your water intake and avoid alcohol or any drugs.
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
## 2015-11-03 ENCOUNTER — Ambulatory Visit (HOSPITAL_COMMUNITY)
Admission: EM | Admit: 2015-11-03 | Discharge: 2015-11-03 | Disposition: A | Discharge status: Home / Self Care | Payer: 59 | Attending: Emergency Medicine | Admitting: Emergency Medicine

## 2015-11-03 ENCOUNTER — Encounter (HOSPITAL_COMMUNITY): Payer: Self-pay | Admitting: *Deleted

## 2015-11-03 DIAGNOSIS — Z3A08 8 weeks gestation of pregnancy: Secondary | ICD-10-CM | POA: Insufficient documentation

## 2015-11-03 DIAGNOSIS — O98811 Other maternal infectious and parasitic diseases complicating pregnancy, first trimester: Secondary | ICD-10-CM | POA: Diagnosis not present

## 2015-11-03 DIAGNOSIS — Z3A01 Less than 8 weeks gestation of pregnancy: Secondary | ICD-10-CM | POA: Diagnosis not present

## 2015-11-03 DIAGNOSIS — B373 Candidiasis of vulva and vagina: Secondary | ICD-10-CM | POA: Insufficient documentation

## 2015-11-03 DIAGNOSIS — Z79899 Other long term (current) drug therapy: Secondary | ICD-10-CM | POA: Diagnosis not present

## 2015-11-03 DIAGNOSIS — B3731 Acute candidiasis of vulva and vagina: Secondary | ICD-10-CM

## 2015-11-03 LAB — POCT URINALYSIS DIP (DEVICE)
Bilirubin Urine: NEGATIVE
GLUCOSE, UA: NEGATIVE mg/dL
KETONES UR: NEGATIVE mg/dL
NITRITE: NEGATIVE
PROTEIN: 30 mg/dL — AB
Specific Gravity, Urine: >=1.03 (ref 1.005–1.030)
UROBILINOGEN UA: 0.2 mg/dL (ref 0.0–1.0)
pH: 5.5 (ref 5.0–8.0)

## 2015-11-03 LAB — POCT PREGNANCY, URINE: Preg Test, Ur: POSITIVE — AB

## 2015-11-03 MED ORDER — FLUCONAZOLE 150 MG PO TABS: 150.0000 mg | ORAL_TABLET | Freq: Once | ORAL | 0 refills | Status: AC

## 2015-11-03 NOTE — ED Provider Notes (Signed)
[facility]    CSN: [id number] Arrival date & time: 11/03/15  1054     History   Chief Complaint Chief Complaint  Patient presents with  . Vaginal Discharge    HPI Elaine Stewart is a 20 y.o. female.   HPI She is a 20 year old woman here for evaluation of vaginal discharge. She reports 2 weeks of heavy vaginal discharge. She states it is yellow in color and sometimes has an odor. She also reports significant itching.  She has tried over-the-counter Monistat without improvement. She denies any urinary symptoms. No new sexual partners. Her last period was around September 10. No pelvic pain.  Past Medical History:  Diagnosis Date  . Acne 09/27/2010  . Arthritis   . Asthma affecting pregnancy, antepartum 05/07/2014  . BV (bacterial vaginosis) 01/13/2014  . GBS (group B streptococcus) UTI complicating pregnancy 01/13/2014    Patient Active Problem List   Diagnosis Date Noted  . 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 Rhinitis, allergic 09/29/2010  . Hypertrophy of tonsils and adenoids 09/27/2010    Past Surgical History:  Procedure Laterality Date  . NO PAST SURGERIES      OB History    Gravida Para Term Preterm AB Living   1             SAB TAB Ectopic Multiple Live Births                   Home Medications    Prior to Admission medications   Medication Sig Start Date End Date Taking? Authorizing Provider  fluconazole (DIFLUCAN) 150 MG tablet Take 1 tablet (150 mg total) by mouth once. Take 2nd pill if symptoms not completely resolved in 3 days. 11/03/15 11/03/15  Charm RingsErin J Brittain Smithey, MD  ibuprofen (ADVIL,MOTRIN) 800 MG tablet Take 1 tablet (800 mg total) by mouth every 8 (eight) hours as needed for mild pain. 09/09/15   Kristen N Ward, DO  naproxen (NAPROSYN) 500 MG tablet Take 1 tablet (500 mg total) by mouth 2 (two) times daily. 08/16/15   Renne CriglerJoshua Geiple, PA-C    Family History History reviewed. No pertinent family  history.  Social History Social History  Substance Use Topics  . Smoking status: Never Smoker  . Smokeless tobacco: Never Used  . Alcohol use No     Allergies   Review of patient's allergies indicates no known allergies.   Review of Systems Review of Systems   Physical Exam Triage Vital Signs ED Triage Vitals [11/03/15 1106]  Enc Vitals Group     BP 109/72     Pulse Rate 79     Resp 17     Temp 98 F (36.7 C)     Temp Source Oral     SpO2 99 %     Weight      Height      Head Circumference      Peak Flow      Pain Score      Pain Loc      Pain Edu?      Excl. in GC?    No data found.   Updated Vital Signs BP 109/72 (BP Location: Right Arm)   Pulse 79   Temp 98 F (36.7 C) (Oral)   Resp 17   LMP 09/13/2015   SpO2 99%   Visual Acuity Right Eye Distance:   Left Eye Distance:   Bilateral Distance:    Right Eye Near:   Left Eye  Near:    Bilateral Near:     Physical Exam  Constitutional: She appears well-developed and well-nourished. No distress.  Cardiovascular: Normal rate.   Pulmonary/Chest: Effort normal.  Genitourinary: There is no rash on the right labia. There is no rash on the left labia. Cervix exhibits friability. Cervix exhibits no motion tenderness and no discharge. Vaginal discharge (clumpy white to yellow) found.     UC Treatments / Results  Labs (all labs ordered are listed, but only abnormal results are displayed) Labs Reviewed  POCT PREGNANCY, URINE - Abnormal; Notable for the following:       Result Value   Preg Test, Ur POSITIVE (*)    All other components within normal limits  POCT URINALYSIS DIP (DEVICE) - Abnormal; Notable for the following:    Hgb urine dipstick LARGE (*)    Protein, ur 30 (*)    Leukocytes, UA TRACE (*)    All other components within normal limits  CERVICOVAGINAL ANCILLARY ONLY    EKG  EKG Interpretation None       Radiology No results found.  Procedures Procedures (including critical  care time)  Medications Ordered in UC Medications - No data to display   Initial Impression / Assessment and Plan / UC Course  I have reviewed the triage vital signs and the nursing notes.  Pertinent labs & imaging results that were available during my care of the patient were reviewed by me and considered in my medical decision making (see chart for details).  Clinical Course    Swabs for wet prep and STI testing collected and sent. Clinical exam consistent with yeast. Will treat with Diflucan. Patient is pregnant. This is an unplanned pregnancy. Based on patient's request, provided [facility] for [facility].  Final Clinical Impressions(s) / UC Diagnoses   Final diagnoses:  Vaginal yeast infection  Less than [redacted] weeks gestation of pregnancy    New Prescriptions Discharge Medication List as of 11/03/2015 11:37 AM    START taking these medications   Details  fluconazole (DIFLUCAN) 150 MG tablet Take 1 tablet (150 mg total) by mouth once. Take 2nd pill if symptoms not completely resolved in 3 days., Starting Tue 11/03/2015, Normal         Charm RingsErin J Jadalyn Oliveri, MD 11/03/15 [phone]

## 2015-11-03 NOTE — ED Triage Notes (Signed)
Patient reports vaginal discharge and irritation x 2 weeks.

## 2015-11-03 NOTE — Discharge Instructions (Signed)
It does look like a yeast infection. Take Diflucan as prescribed.  Your pregnancy test is positive today. Based on your approximate last period, you are 6-8 weeks. You can contact [facility] if you do not wish to continue the pregnancy. Their phone number is [phone].  Follow-up as needed.

## 2015-11-04 LAB — CERVICOVAGINAL ANCILLARY ONLY
Chlamydia: NEGATIVE
NEISSERIA GONORRHEA: NEGATIVE
WET PREP (BD AFFIRM): POSITIVE — AB

## 2015-11-09 ENCOUNTER — Telehealth (HOSPITAL_COMMUNITY): Payer: Self-pay | Admitting: Emergency Medicine

## 2015-11-09 ENCOUNTER — Ambulatory Visit (HOSPITAL_COMMUNITY): Admission: EM | Admit: 2015-11-09 | Discharge: 2015-11-09 | Discharge status: Against Medical Advice | Payer: 59

## 2015-11-09 NOTE — Telephone Encounter (Signed)
Called pt and notified of recent lab results  Pt ID'd properly... Reports she came in today to get seen but it was too busy Reports dark blood w/clots associated w/back pain [facility] and [facility]  Also states that her vag irritation and vag d/c have subsided and did not need further tx.  Pt verb understanding.

## 2015-11-09 NOTE — Telephone Encounter (Signed)
-----   Message from Eustace MooreLaura W Murray, MD sent at 11/04/2015  5:49 PM EDT ----- Please let patient know that tests for candida (yeast) and gardnerella (bacterial vaginosis) were positive.   [facility]; take as directed.   If vag discharge/irritation persist would send rx for metronidazole 500mg  bid x 7d #14 no refills.  Ok to take in pregnancy. Followup with [facility] as discussed at visit.  Recheck as needed.

## 2015-11-09 NOTE — ED Notes (Signed)
Bed: UC09 Expected date:  Expected time:  Means of arrival:  Comments: 

## 2015-11-10 ENCOUNTER — Inpatient Hospital Stay (HOSPITAL_COMMUNITY)
Admission: AD | Admit: 2015-11-10 | Discharge: 2015-11-11 | Disposition: A | Discharge status: Home / Self Care | Payer: 59 | Source: Ambulatory Visit | Attending: Family Medicine | Admitting: Family Medicine

## 2015-11-10 ENCOUNTER — Inpatient Hospital Stay (HOSPITAL_COMMUNITY)
Admission: AD | Admit: 2015-11-10 | Discharge: 2015-11-10 | Discharge status: Against Medical Advice | Payer: 59 | Source: Ambulatory Visit | Attending: Family Medicine | Admitting: Family Medicine

## 2015-11-10 ENCOUNTER — Inpatient Hospital Stay (HOSPITAL_COMMUNITY): Payer: 59

## 2015-11-10 ENCOUNTER — Encounter (HOSPITAL_COMMUNITY): Payer: Self-pay | Admitting: *Deleted

## 2015-11-10 DIAGNOSIS — O021 Missed abortion: Secondary | ICD-10-CM

## 2015-11-10 DIAGNOSIS — O209 Hemorrhage in early pregnancy, unspecified: Secondary | ICD-10-CM | POA: Diagnosis present

## 2015-11-10 DIAGNOSIS — O039 Complete or unspecified spontaneous abortion without complication: Secondary | ICD-10-CM | POA: Diagnosis not present

## 2015-11-10 DIAGNOSIS — Z3A08 8 weeks gestation of pregnancy: Secondary | ICD-10-CM | POA: Insufficient documentation

## 2015-11-10 LAB — CBC
HEMATOCRIT: 35.4 % — AB (ref 36.0–46.0)
HEMOGLOBIN: 12.1 g/dL (ref 12.0–15.0)
MCH: 29.4 pg (ref 26.0–34.0)
MCHC: 34.2 g/dL (ref 30.0–36.0)
MCV: 86.1 fL (ref 78.0–100.0)
Platelets: 273 10*3/uL (ref 150–400)
RBC: 4.11 MIL/uL (ref 3.87–5.11)
RDW: 14 % (ref 11.5–15.5)
WBC: 6.5 10*3/uL (ref 4.0–10.5)

## 2015-11-10 LAB — WET PREP, GENITAL
CLUE CELLS WET PREP: NONE SEEN
SPERM: NONE SEEN
TRICH WET PREP: NONE SEEN
Yeast Wet Prep HPF POC: NONE SEEN

## 2015-11-10 LAB — ABO/RH: ABO/RH(D): AB POS

## 2015-11-10 LAB — HCG, QUANTITATIVE, PREGNANCY: hCG, Beta Chain, Quant, S: 2385 m[IU]/mL — ABNORMAL HIGH (ref <5)

## 2015-11-10 MED ORDER — IBUPROFEN 800 MG PO TABS: 800.0000 mg | ORAL_TABLET | Freq: Three times a day (TID) | ORAL | 0 refills | Status: DC | PRN

## 2015-11-10 MED ORDER — HYDROCODONE-ACETAMINOPHEN 5-325 MG PO TABS: 1.0000 | ORAL_TABLET | ORAL | 0 refills | Status: DC | PRN

## 2015-11-10 NOTE — MAU Provider Note (Signed)
History     CSN: [id number]  Arrival date and time: 11/10/15 2151   First Provider Initiated Contact with Patient 11/10/15 2229      Chief Complaint  Patient presents with  . Vaginal Bleeding   Vaginal Bleeding  The patient's primary symptoms include pelvic pain and vaginal bleeding. This is a new problem. The current episode started yesterday. The problem occurs constantly. The problem has been unchanged. Pain severity now: 6/10  The problem affects both sides. She is pregnant. Associated symptoms include abdominal pain, constipation and dysuria. Pertinent negatives include no chills, diarrhea, fever, frequency, nausea, urgency or vomiting. The vaginal discharge was bloody. The vaginal bleeding is heavier than menses. She has been passing clots (about the size of a golf ball. ). She has not been passing tissue. Nothing aggravates the symptoms. She has tried nothing for the symptoms. Her menstrual history has been regular (LMP 09/13/15 ).   Past Medical History:  Diagnosis Date  . Acne 09/27/2010  . Arthritis   . Asthma affecting pregnancy, antepartum 05/07/2014  . BV (bacterial vaginosis) 01/13/2014  . GBS (group B streptococcus) UTI complicating pregnancy 01/13/2014    Past Surgical History:  Procedure Laterality Date  . NO PAST SURGERIES      No family history on file.  Social History  Substance Use Topics  . Smoking status: Never Smoker  . Smokeless tobacco: Never Used  . Alcohol use No    Allergies: No Known Allergies  Prescriptions Prior to Admission  Medication Sig Dispense Refill Last Dose  . ibuprofen (ADVIL,MOTRIN) 800 MG tablet Take 1 tablet (800 mg total) by mouth every 8 (eight) hours as needed for mild pain. 30 tablet 0 More than a month at Unknown time  . naproxen (NAPROSYN) 500 MG tablet Take 1 tablet (500 mg total) by mouth 2 (two) times daily. 20 tablet 0     Review of Systems  Constitutional: Negative for chills and fever.  Gastrointestinal: Positive for  abdominal pain and constipation. Negative for diarrhea, nausea and vomiting.  Genitourinary: Positive for dysuria, pelvic pain and vaginal bleeding. Negative for frequency and urgency.   Physical Exam   Last menstrual period 09/13/2015, unknown if currently breastfeeding.  Physical Exam  Nursing note and vitals reviewed. Constitutional: She is oriented to person, place, and time. She appears well-developed. No distress.  HENT:  Head: Normocephalic.  Cardiovascular: Normal rate.   Respiratory: Effort normal.  GI: Soft. There is no tenderness. There is no rebound.  Genitourinary:  Genitourinary Comments:  External: no lesion Vagina: scant amount of mucousy blood seen  Cervix: pink, smooth, no CMT Uterus: NSSC Adnexa: NT  Musculoskeletal: Normal range of motion.  Neurological: She is alert and oriented to person, place, and time.  Skin: Skin is warm and dry.  Psychiatric: She has a normal mood and affect.   Results for Lynnda ShieldsURNER, Elaine (MRN [id number]) as of 11/10/2015 23:35  Ref. Range 11/10/2015 15:33 11/10/2015 22:38  HCG, Beta Chain, Quant, S Latest Ref Range: <5 mIU/mL 2,385 (H)   WBC Latest Ref Range: 4.0 - 10.5 K/uL 6.5   RBC Latest Ref Range: 3.87 - 5.11 MIL/uL 4.11   Hemoglobin Latest Ref Range: 12.0 - 15.0 g/dL 41.312.1   HCT Latest Ref Range: 36.0 - 46.0 % 35.4 (L)   MCV Latest Ref Range: 78.0 - 100.0 fL 86.1   MCH Latest Ref Range: 26.0 - 34.0 pg 29.4   MCHC Latest Ref Range: 30.0 - 36.0 g/dL 24.434.2   RDW Latest  Ref Range: 11.5 - 15.5 % 14.0   Platelets Latest Ref Range: 150 - 400 K/uL 273   ABO/RH(D) Unknown AB POS   Yeast Wet Prep HPF POC Latest Ref Range: NONE SEEN   NONE SEEN  Trich, Wet Prep Latest Ref Range: NONE SEEN   NONE SEEN  Clue Cells Wet Prep HPF POC Latest Ref Range: NONE SEEN   NONE SEEN  WBC, Wet Prep HPF POC Latest Ref Range: NONE SEEN   MODERATE (A)   [place] Ob Comp Less 14 Wks  Result Date: 11/10/2015 CLINICAL DATA:  Vaginal bleeding for 2 days. Estimated  gestational age by LMP is 8 weeks 2 days. Quantitative beta HCG is 2,385. EXAM: OBSTETRIC <14 WK ULTRASOUND TECHNIQUE: Transabdominal ultrasound was performed for evaluation of the gestation as well as the maternal uterus and adnexal regions. COMPARISON:  None. FINDINGS: Intrauterine gestational sac: A single intrauterine pregnancy is identified. Yolk sac:  Yolk sac is not visualized. Embryo:  Fetal pole is present. Cardiac Activity: No fetal cardiac activity is identified. CRL:   21.2  mm   8 w 5 d                  US EDC: 06/16/2016 Subchorionic hemorrhage:  None visualized. Maternal uterus/adnexae: Uterus is anteverted. No myometrial mass lesions identified. Both ovaries are visualized and appear normal. No abnormal adnexal masses. No free fluid in the pelvis. Patient refused transvaginal imaging. IMPRESSION: Single intrauterine pregnancy. Estimated gestational age by crown-rump length is 8 weeks 5 days. No fetal cardiac activity is identified. Based on no fetal cardiac activity with crown-rump length greater than 7 mm, findings meet definitive criteria for failed pregnancy. This follows SRU consensus guidelines: Diagnostic Criteria for Nonviable Pregnancy Early in the First Trimester. Macy Mis Engl J Med [id number]:1443-51. Electronically Signed   By: Burman NievesWilliam  Stevens M.D.   On: 11/10/2015 23:15    MAU Course  Procedures  MDM Discussed options with the patient at length. Expectant management v cytotec. R/B discussed. Patient would like expectant management. Bleeding precautions reviewed. Anticipatory guidance given.   Assessment and Plan   1. Abortion, missed   2. Vaginal bleeding in pregnancy, first trimester    DC home Comfort measures reviewed  Bleeding precautions RX: ibuprofen 800mg  PRN #30, vicodin PRN #20  Return to MAU as needed FU with OB as planned  Follow-up Information    [facility]   Specialty:  Obstetrics and Gynecology Why:  they will call you  with an appointment  Contact information: [address] [place] [id number] [phone]           Tawnya CrookHogan, Chrystine Frogge Donovan 11/10/2015, 11:36 PM

## 2015-11-10 NOTE — Discharge Instructions (Signed)
Miscarriage  A miscarriage is the sudden loss of an unborn baby (fetus) before the 20th week of pregnancy. Most miscarriages happen in the first 3 months of pregnancy. Sometimes, it happens before a woman even knows she is pregnant. A miscarriage is also called a "spontaneous miscarriage" or "early pregnancy loss." Having a miscarriage can be an emotional experience. Talk with your caregiver about any questions you may have about miscarrying, the grieving process, and your future pregnancy plans.  CAUSES    Problems with the fetal chromosomes that make it impossible for the baby to develop normally. Problems with the baby's genes or chromosomes are most often the result of errors that occur, by chance, as the embryo divides and grows. The problems are not inherited from the parents.   Infection of the cervix or uterus.    Hormone problems.    Problems with the cervix, such as having an incompetent cervix. This is when the tissue in the cervix is not strong enough to hold the pregnancy.    Problems with the uterus, such as an abnormally shaped uterus, uterine fibroids, or congenital abnormalities.    Certain medical conditions.    Smoking, drinking alcohol, or taking illegal drugs.    Trauma.   Often, the cause of a miscarriage is unknown.   SYMPTOMS    Vaginal bleeding or spotting, with or without cramps or pain.   Pain or cramping in the abdomen or lower back.   Passing fluid, tissue, or blood clots from the vagina.  DIAGNOSIS   Your caregiver will perform a physical exam. You may also have an ultrasound to confirm the miscarriage. Blood or urine tests may also be ordered.  TREATMENT    Sometimes, treatment is not necessary if you naturally pass all the fetal tissue that was in the uterus. If some of the fetus or placenta remains in the body (incomplete miscarriage), tissue left behind may become infected and must be removed. Usually, a dilation and curettage (D and C) procedure is performed.  During a D and C procedure, the cervix is widened (dilated) and any remaining fetal or placental tissue is gently removed from the uterus.   Antibiotic medicines are prescribed if there is an infection. Other medicines may be given to reduce the size of the uterus (contract) if there is a lot of bleeding.   If you have Rh negative blood and your baby was Rh positive, you will need a Rh immunoglobulin shot. This shot will protect any future baby from having Rh blood problems in future pregnancies.  HOME CARE INSTRUCTIONS    Your caregiver may order bed rest or may allow you to continue light activity. Resume activity as directed by your caregiver.   Have someone help with home and family responsibilities during this time.    Keep track of the number of sanitary pads you use each day and how soaked (saturated) they are. Write down this information.    Do not use tampons. Do not douche or have sexual intercourse until approved by your caregiver.    Only take over-the-counter or prescription medicines for pain or discomfort as directed by your caregiver.    Do not take aspirin. Aspirin can cause bleeding.    Keep all follow-up appointments with your caregiver.    If you or your partner have problems with grieving, talk to your caregiver or seek counseling to help cope with the pregnancy loss. Allow enough time to grieve before trying to get pregnant again.     SEEK IMMEDIATE MEDICAL CARE IF:    You have severe cramps or pain in your back or abdomen.   You have a fever.   You pass large blood clots (walnut-sized or larger) ortissue from your vagina. Save any tissue for your caregiver to inspect.    Your bleeding increases.    You have a thick, bad-smelling vaginal discharge.   You become lightheaded, weak, or you faint.    You have chills.   MAKE SURE YOU:   Understand these instructions.   Will watch your condition.   Will get help right away if you are not doing well or get worse.     This  information is not intended to replace advice given to you by your health care provider. Make sure you discuss any questions you have with your health care provider.     Document Released: 06/15/2000 Document Revised: 04/16/2012 Document Reviewed: 02/08/2011  Elsevier Interactive Patient Education 2016 Elsevier Inc.

## 2015-11-10 NOTE — MAU Note (Signed)
Pt states that she has to pick up her kids, states she may try to come back around 8 this evening.

## 2015-11-10 NOTE — MAU Note (Signed)
Patient states that a week ago she started to spot but 2 days ago the patient states that the bleeding picked up and she has been passing clots. Patient states that she has been using 6 pads a day. Patient also states that she has sharp pain in her l;ower back that started today that is constant.

## 2015-11-10 NOTE — MAU Note (Signed)
Urine sent to lab 

## 2015-11-11 ENCOUNTER — Encounter (HOSPITAL_COMMUNITY): Payer: Self-pay | Admitting: *Deleted

## 2015-11-11 DIAGNOSIS — O039 Complete or unspecified spontaneous abortion without complication: Secondary | ICD-10-CM | POA: Diagnosis not present

## 2015-11-11 LAB — GC/CHLAMYDIA PROBE AMP (~~LOC~~) NOT AT ARMC
Chlamydia: NEGATIVE
Neisseria Gonorrhea: NEGATIVE

## 2015-12-03 ENCOUNTER — Encounter: Payer: Self-pay | Admitting: Family

## 2015-12-03 ENCOUNTER — Ambulatory Visit (INDEPENDENT_AMBULATORY_CARE_PROVIDER_SITE_OTHER): Payer: 59 | Admitting: Family

## 2015-12-03 VITALS — BP 106/65 | HR 99 | Wt 114.1 lb

## 2015-12-03 DIAGNOSIS — O039 Complete or unspecified spontaneous abortion without complication: Secondary | ICD-10-CM

## 2015-12-03 LAB — CBC
HCT: 25.4 % — ABNORMAL LOW (ref 35.0–45.0)
Hemoglobin: 8.1 g/dL — ABNORMAL LOW (ref 11.7–15.5)
MCH: 26.6 pg — ABNORMAL LOW (ref 27.0–33.0)
MCHC: 31.9 g/dL — ABNORMAL LOW (ref 32.0–36.0)
MCV: 83.6 fL (ref 80.0–100.0)
MPV: 8.6 fL (ref 7.5–12.5)
PLATELETS: 383 10*3/uL (ref 140–400)
RBC: 3.04 MIL/uL — AB (ref 3.80–5.10)
RDW: 14.8 % (ref 11.0–15.0)
WBC: 9.6 10*3/uL (ref 3.8–10.8)

## 2015-12-03 NOTE — Progress Notes (Signed)
History   [id number]   Chief Complaint  Patient presents with  . Follow-up    sab    HPI Elaine Stewart is a 20 y.o. female G2P0 here for follow-up after failed pregnancy identified at 8 wks in MAU.  Pt requested expectant management and reports going home and passing clots that same night.  Bled for approximately 1.5 weeks.  No bleeding at this time.  Reports intermittent dizziness/fatigue.    Desires birth control, uncertain about which method.  Considering depo-provera.  Familiar with the side effects.  Felt well informed regarding the options, learned about them in a Centering Pregnancy class.    Patient's last menstrual period was 09/13/2015.  OB History  Gravida Para Term Preterm AB Living  2            SAB TAB Ectopic Multiple Live Births               # Outcome Date GA Lbr Len/2nd Weight Sex Delivery Anes PTL Lv  2 Gravida           1 Gravida      Vag-Spont         Past Medical History:  Diagnosis Date  . Acne 09/27/2010  . Arthritis   . Asthma affecting pregnancy, antepartum 05/07/2014  . BV (bacterial vaginosis) 01/13/2014  . GBS (group B streptococcus) UTI complicating pregnancy 01/13/2014    No family history on file.  Social History   Social History  . Marital status: Single    Spouse name: N/A  . Number of children: 1  . Years of education: 12+   Occupational History  . 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 Smoking status: Never Smoker  . Smokeless tobacco: Never Used  . Alcohol use No  . Drug use: No  . Sexual activity: Not Currently    Partners: Male    Birth control/ protection: Abstinence   Other Topics Concern  . None   Social History Scientist, research (medical)arrative   Student at [employer]. Mostly online courses.   Lives with her mother and her son (07/2014).    No Known Allergies  Current Outpatient Prescriptions on File Prior to Visit  Medication Sig Dispense Refill  . HYDROcodone-acetaminophen (NORCO/VICODIN) 5-325 MG tablet  Take 1-2 tablets by mouth every 4 (four) hours as needed. 20 tablet 0  . ibuprofen (ADVIL,MOTRIN) 800 MG tablet Take 1 tablet (800 mg total) by mouth every 8 (eight) hours as needed. 30 tablet 0   No current facility-administered medications on file prior to visit.      Physical Exam   Vitals:   12/03/15 0815  BP: 106/65  Pulse: 99  Weight: 114 lb 1.6 oz (51.8 kg)    Physical Exam  Constitutional: She is oriented to person, place, and time. She appears well-developed and well-nourished.  HENT:  Head: Normocephalic.  Eyes: Pupils are equal, round, and reactive to light.  Neck: Normal range of motion. Neck supple.  Cardiovascular: Normal rate, regular rhythm and normal heart sounds.   Respiratory: Effort normal and breath sounds normal.  GI: Soft. She exhibits no mass. There is no guarding.  Genitourinary:  Genitourinary Comments: Uterus nml size  Neurological: She is alert and oriented to person, place, and time. She has normal reflexes.  Skin: Skin is warm and dry.    MAU Course  Procedures  Assessment and Plan  Post Miscarriage  Plan: CBC; HCG Reviewed the various  family planning options; plans to reschedule for method  Marlis EdelsonWalidah N Karim, CNM 12/03/2015 8:24 AM

## 2015-12-04 LAB — HCG, QUANTITATIVE, PREGNANCY: HCG, BETA CHAIN, QUANT, S: 11.4 m[IU]/mL — AB

## 2015-12-05 ENCOUNTER — Encounter: Payer: Self-pay | Admitting: Family

## 2015-12-05 DIAGNOSIS — D649 Anemia, unspecified: Secondary | ICD-10-CM | POA: Insufficient documentation

## 2015-12-08 ENCOUNTER — Telehealth: Payer: Self-pay | Admitting: Family

## 2015-12-08 DIAGNOSIS — D509 Iron deficiency anemia, unspecified: Secondary | ICD-10-CM

## 2015-12-08 MED ORDER — FERROUS SULFATE 325 (65 FE) MG PO TABS: 325.0000 mg | ORAL_TABLET | Freq: Two times a day (BID) | ORAL | 1 refills | Status: DC

## 2015-12-08 NOTE — Telephone Encounter (Signed)
Called and notified regarding low hgb and ferrous sulfate sent to pharmacy.

## 2015-12-10 ENCOUNTER — Ambulatory Visit (INDEPENDENT_AMBULATORY_CARE_PROVIDER_SITE_OTHER): Payer: 59

## 2015-12-10 VITALS — BP 125/68 | HR 93

## 2015-12-10 DIAGNOSIS — Z30013 Encounter for initial prescription of injectable contraceptive: Secondary | ICD-10-CM

## 2015-12-10 DIAGNOSIS — Z3042 Encounter for surveillance of injectable contraceptive: Secondary | ICD-10-CM | POA: Diagnosis not present

## 2015-12-10 MED ORDER — MEDROXYPROGESTERONE ACETATE 150 MG/ML IM SUSP
150.0000 mg | Freq: Once | INTRAMUSCULAR | Status: AC
  Administered 2015-12-10: 150 mg via INTRAMUSCULAR

## 2015-12-10 MED ORDER — MEDROXYPROGESTERONE ACETATE 150 MG/ML IM SUSP: 150.0000 mg | INTRAMUSCULAR | 0 refills | Status: DC

## 2015-12-10 NOTE — Progress Notes (Signed)
Patient is here to get her first depo-provera. Patient reports no unprotected in the last 2 weeks. Patient reports being on her period at this time. Patient will follow up between 02/22-03/8

## 2015-12-16 ENCOUNTER — Ambulatory Visit (INDEPENDENT_AMBULATORY_CARE_PROVIDER_SITE_OTHER): Payer: 59 | Admitting: *Deleted

## 2015-12-16 DIAGNOSIS — Z3202 Encounter for pregnancy test, result negative: Secondary | ICD-10-CM

## 2015-12-16 LAB — POCT PREGNANCY, URINE: PREG TEST UR: NEGATIVE

## 2016-02-10 ENCOUNTER — Encounter: Payer: Self-pay | Admitting: Obstetrics & Gynecology

## 2016-02-25 ENCOUNTER — Ambulatory Visit: Payer: 59

## 2016-03-04 ENCOUNTER — Ambulatory Visit (INDEPENDENT_AMBULATORY_CARE_PROVIDER_SITE_OTHER): Payer: 59 | Admitting: Obstetrics & Gynecology

## 2016-03-04 VITALS — BP 110/80 | HR 66 | Wt 116.1 lb

## 2016-03-04 DIAGNOSIS — Z308 Encounter for other contraceptive management: Secondary | ICD-10-CM

## 2016-03-04 NOTE — Progress Notes (Signed)
   Subjective:    Patient ID: Elaine Stewart, female    DOB: May 28, 1995, 21 y.o.   MRN: [id number]  HPI 21 yo S AA P1 (2 yo son) here today to discuss depo provera. She has had 1 shot (has never used contraception in the past). She has had irregular bleeding. Has only had 2 weeks without bleeding since injection.   Review of Systems     Objective:   Physical Exam        Assessment & Plan:  Contraception- We discussed other options including Mirena. At this point she is opting for another depo provera.

## 2016-05-20 ENCOUNTER — Ambulatory Visit: Payer: 59 | Admitting: *Deleted

## 2016-05-20 DIAGNOSIS — D509 Iron deficiency anemia, unspecified: Secondary | ICD-10-CM

## 2016-05-20 MED ORDER — FERROUS SULFATE 325 (65 FE) MG PO TABS: 325.0000 mg | ORAL_TABLET | Freq: Two times a day (BID) | ORAL | 1 refills | Status: DC

## 2016-05-20 NOTE — Progress Notes (Signed)
Patient states never knew about needing to take iron, but knew it was low. States never got the iron filled . Will resend iron rx. Patient will schedule pap appt for next visit as she is now 21 years old. Explained to her will not get shot today as will not be covered by insurance. Will be rescheduled for next week.

## 2016-05-23 ENCOUNTER — Encounter: Payer: Self-pay | Admitting: Obstetrics & Gynecology

## 2016-05-23 ENCOUNTER — Ambulatory Visit (INDEPENDENT_AMBULATORY_CARE_PROVIDER_SITE_OTHER): Payer: 59 | Admitting: Obstetrics & Gynecology

## 2016-05-23 VITALS — BP 135/72 | HR 84 | Ht 62.0 in | Wt 122.3 lb

## 2016-05-23 DIAGNOSIS — Z124 Encounter for screening for malignant neoplasm of cervix: Secondary | ICD-10-CM

## 2016-05-23 DIAGNOSIS — Z113 Encounter for screening for infections with a predominantly sexual mode of transmission: Secondary | ICD-10-CM | POA: Diagnosis not present

## 2016-05-23 DIAGNOSIS — Z01419 Encounter for gynecological examination (general) (routine) without abnormal findings: Secondary | ICD-10-CM

## 2016-05-23 DIAGNOSIS — Z3009 Encounter for other general counseling and advice on contraception: Secondary | ICD-10-CM

## 2016-05-23 DIAGNOSIS — Z3042 Encounter for surveillance of injectable contraceptive: Secondary | ICD-10-CM

## 2016-05-23 MED ORDER — MEDROXYPROGESTERONE ACETATE 150 MG/ML IM SUSP
150.0000 mg | Freq: Once | INTRAMUSCULAR | Status: AC
  Administered 2016-05-23: 150 mg via INTRAMUSCULAR

## 2016-05-23 NOTE — Patient Instructions (Signed)
Hormonal Contraception Information Hormonal contraception is a type of birth control that uses hormones to prevent pregnancy. It usually involves a combination of the hormones estrogen and progesterone or only the hormone progesterone. Hormonal contraception works in these ways:  It thickens the mucus in the cervix, making it harder for sperm to enter the uterus.  It changes the lining of the uterus, making it harder for an egg to implant.  It may stop the ovaries from releasing eggs (ovulation). Some women who take hormonal contraceptives that contain only progesterone may continue to ovulate. Hormonal contraception cannot prevent sexually transmitted infections (STIs). Pregnancy may still occur. Estrogen and progesterone contraceptives  Contraceptives that use a combination of estrogen and progesterone are available in these forms:  Pill. Pills come in different combinations of hormones. They must be taken at the same time each day. Pills can affect your period, causing you to get your period once every three months or not at all.  Patch. The patch must be worn on the lower abdomen for three weeks and then removed on the fourth.  Vaginal ring. The ring is placed in the vagina and left there for three weeks. It is then removed for one week. Progesterone contraceptives Contraceptives that use progesterone only are available in these forms:  Pill. Pills should be taken every day of the cycle.  Intrauterine device (IUD). This device is inserted into the uterus and removed or replaced every five years or sooner.  Implant. Plastic rods are placed under the skin of the upper arm. They are removed or replaced every three years or sooner.  Injection. The injection is given once every 90 days. What are the side effects? The side effects of estrogen and progesterone contraceptives include:  Nausea.  Headaches.  Breast tenderness.  Bleeding or spotting between menstrual cycles.  High  blood pressure (rare).  Strokes, heart attacks, or blood clots (rare) Side effects of progesterone-only contraceptives include:  Nausea.  Headaches.  Breast tenderness.  Unpredictable menstrual bleeding.  High blood pressure (rare). Talk to your health care provider about what side effects may affect you. Where to find more information:  Ask your health care provider for more information and resources about hormonal contraception.  [place] Department of Health and Human Services Office on Women's Health: www.womenshealth.gov Questions to ask:  What type of hormonal contraception is right for me?  How long should I plan to use hormonal contraception?  What are the side effects of the hormonal contraception method I choose?  How can I prevent STIs while using hormonal contraception? Contact a health care provider if:  You start taking hormonal contraceptives and you develop persistent or severe side effects. Summary  Estrogen and progesterone are hormones used in many forms of birth control.  Talk to your health care provider about what side effects may affect you.  Hormonal contraception cannot prevent sexually transmitted infections (STIs).  Ask your health care provider for more information and resources about hormonal contraception. This information is not intended to replace advice given to you by your health care provider. Make sure you discuss any questions you have with your health care provider. Document Released: 01/09/2007 Document Revised: 11/20/2015 Document Reviewed: 11/20/2015 Elsevier Interactive Patient Education  2017 Elsevier Inc.  

## 2016-05-23 NOTE — Addendum Note (Signed)
Addended by: Sherre LainASH, Taccara Bushnell A on: 05/23/2016 04:04 PM   Modules accepted: Orders

## 2016-05-23 NOTE — Progress Notes (Signed)
Subjective:     Elaine Stewart is a 21 y.o. female here for a routine exam.  G2P1011  LMP 3 months prev. On Deop Provera. The dose today will be her third injection. Current complaints: no problems. No FH of breast , ovary, uterine or cervical CA.  Pt has no h/o STIs.      Gynecologic History No LMP recorded. Contraception: Depo-Provera injections Last Pap: n/a.  Last mammogram: n/a.   Obstetric History OB History  Gravida Para Term Preterm AB Living  2            SAB TAB Ectopic Multiple Live Births               # Outcome Date GA Lbr Len/2nd Weight Sex Delivery Anes PTL Lv  2 Gravida           1 Gravida      Vag-Spont        The following portions of the patient's history were reviewed and updated as appropriate: allergies, current medications, past family history, past medical history, past social history, past surgical history and problem list.  Review of Systems Pertinent items are noted in HPI.    Objective:  BP 135/72   Pulse 84   Ht 5\' 2"  (1.575 m)   Wt 122 lb 4.8 oz (55.5 kg)   BMI 22.37 kg/m  General Appearance:    Alert, cooperative, no distress, appears stated age  Head:    Normocephalic, without obvious abnormality, atraumatic  Eyes:    conjunctiva/corneas clear, EOM's intact, both eyes  Ears:    Normal external ear canals, both ears  Nose:   Nares normal, septum midline, mucosa normal, no drainage    or sinus tenderness  Throat:   Lips, mucosa, and tongue normal; teeth and gums normal  Neck:   Supple, symmetrical, trachea midline, no adenopathy;    thyroid:  no enlargement/tenderness/nodules  Back:     Symmetric, no curvature, ROM normal, no CVA tenderness  Lungs:     Clear to auscultation bilaterally, respirations unlabored  Chest Wall:    No tenderness or deformity   Heart:    Regular rate and rhythm, S1 and S2 normal, no murmur, rub   or gallop  Breast Exam:    No tenderness, masses, or nipple abnormality  Abdomen:     Soft, non-tender, bowel sounds  active all four quadrants,    no masses, no organomegaly  Genitalia:    Normal female without lesion, discharge or tenderness     Extremities:   Extremities normal, atraumatic, no cyanosis or edema  Pulses:   2+ and symmetric all extremities  Skin:   Skin color, texture, turgor normal, no rashes or lesions     Assessment:    Healthy female exam.   Contraception counseling- cont Depo provera   Plan:    Follow up in: 1 year.    F/i in 3 months for Depo provera F/u PAP and cx Depo provera 150mg  IM today  Nation Cradle L. Harraway-Smith, M.D., Evern CoreFACOG

## 2016-05-25 ENCOUNTER — Other Ambulatory Visit: Payer: Self-pay | Admitting: Obstetrics & Gynecology

## 2016-05-25 DIAGNOSIS — A599 Trichomoniasis, unspecified: Secondary | ICD-10-CM

## 2016-05-25 LAB — CYTOLOGY - PAP
CHLAMYDIA, DNA PROBE: NEGATIVE
Diagnosis: NEGATIVE
NEISSERIA GONORRHEA: NEGATIVE
Trichomonas: POSITIVE — AB

## 2016-05-25 MED ORDER — METRONIDAZOLE 500 MG PO TABS: ORAL_TABLET | ORAL | 0 refills | Status: DC

## 2016-05-26 NOTE — Progress Notes (Signed)
Pt notified of test results showing +trich and need for treatment. Her partner will also need to be treated. All information given for medication administration and not to have sex for 1 full week after her treatment and his treatment.  Pt voiced understanding of all information and instructions given.

## 2016-06-27 ENCOUNTER — Ambulatory Visit (HOSPITAL_COMMUNITY)
Admission: EM | Admit: 2016-06-27 | Discharge: 2016-06-27 | Disposition: A | Discharge status: Home / Self Care | Payer: 59 | Attending: Family Medicine | Admitting: Family Medicine

## 2016-06-27 ENCOUNTER — Encounter (HOSPITAL_COMMUNITY): Payer: Self-pay | Admitting: Emergency Medicine

## 2016-06-27 DIAGNOSIS — N760 Acute vaginitis: Secondary | ICD-10-CM | POA: Insufficient documentation

## 2016-06-27 DIAGNOSIS — N898 Other specified noninflammatory disorders of vagina: Secondary | ICD-10-CM | POA: Insufficient documentation

## 2016-06-27 DIAGNOSIS — B9689 Other specified bacterial agents as the cause of diseases classified elsewhere: Secondary | ICD-10-CM | POA: Insufficient documentation

## 2016-06-27 DIAGNOSIS — N939 Abnormal uterine and vaginal bleeding, unspecified: Secondary | ICD-10-CM | POA: Insufficient documentation

## 2016-06-27 DIAGNOSIS — N72 Inflammatory disease of cervix uteri: Secondary | ICD-10-CM | POA: Diagnosis not present

## 2016-06-27 LAB — POCT PREGNANCY, URINE: Preg Test, Ur: NEGATIVE

## 2016-06-27 MED ORDER — AZITHROMYCIN 250 MG PO TABS
1000.0000 mg | ORAL_TABLET | Freq: Once | ORAL | Status: AC
  Administered 2016-06-27: 1000 mg via ORAL

## 2016-06-27 MED ORDER — AZITHROMYCIN 250 MG PO TABS
ORAL_TABLET | ORAL | Status: AC
  Filled 2016-06-27: qty 4

## 2016-06-27 MED ORDER — METRONIDAZOLE 500 MG PO TABS
500.0000 mg | ORAL_TABLET | Freq: Two times a day (BID) | ORAL | 0 refills | Status: DC
Start: 2016-06-27 — End: 2017-03-15

## 2016-06-27 NOTE — Discharge Instructions (Signed)
You are being treated for bacterial vaginosis and inflammation of the cervix which may be caused by the vaginosis or possibly another infection such as chlamydia. Take the medication as directed. Results of these tests should be back in one to 2 days. If there are any positives that need to be treated this likely can be treated over the telephone.

## 2016-06-27 NOTE — ED Triage Notes (Signed)
[facility] with a complaint of a vaginal discharge and irritation x 1 week.

## 2016-06-27 NOTE — ED Provider Notes (Signed)
CSN: [id number]     Arrival date & time 06/27/16  1224 History   First MD Initiated Contact with Patient 06/27/16 1304     Chief Complaint  Patient presents with  . Vaginal Discharge   (Consider location/radiation/quality/duration/timing/severity/associated sxs/prior Treatment) 21 year old female complaining of a vaginal discharge and irritation with a small amount of scant spotting/bleeding for 8 days. Thinking it may be a candidal infection she tried Monistat for 7 days. He did not change anything. She receives Depo-Provera injections for birth control. Denies pelvic pain. History of BV and group B strep.      Past Medical History:  Diagnosis Date  . Acne 09/27/2010  . Arthritis   . Asthma affecting pregnancy, antepartum 05/07/2014  . BV (bacterial vaginosis) 01/13/2014  . GBS (group B streptococcus) UTI complicating pregnancy 01/13/2014   Past Surgical History:  Procedure Laterality Date  . NO PAST SURGERIES     History reviewed. No pertinent family history. Social History  Substance Use Topics  . Smoking status: Never Smoker  . Smokeless tobacco: Never Used  . Alcohol use No   OB History    Gravida Para Term Preterm AB Living   2             SAB TAB Ectopic Multiple Live Births                 Review of Systems  Constitutional: Negative.   Respiratory: Negative.   Gastrointestinal: Negative.   Genitourinary: Positive for vaginal bleeding and vaginal discharge. Negative for dysuria and pelvic pain.       As per history of present illness  Psychiatric/Behavioral: Negative.   All other systems reviewed and are negative.   Allergies  Patient has no known allergies.  Home Medications   Prior to Admission medications   Medication Sig Start Date End Date Taking? Authorizing Provider  medroxyPROGESTERone (DEPO-PROVERA) 150 MG/ML injection Inject 1 mL (150 mg total) into the muscle every 3 (three) months. 12/10/15   Marlis Edelson, CNM  metroNIDAZOLE (FLAGYL) 500 MG  tablet Take 1 tablet (500 mg total) by mouth 2 (two) times daily. X 7 days 06/27/16   Hayden Rasmussen, NP   Meds Ordered and Administered this Visit   Medications  azithromycin (ZITHROMAX) tablet 1,000 mg (not administered)    BP 104/62 (BP Location: Right Arm)   Pulse 88   Temp 98.7 F (37.1 C) (Oral)   Resp 16   SpO2 100%  No data found.   Physical Exam  Constitutional: She is oriented to person, place, and time. She appears well-developed and well-nourished. No distress.  Neck: Normal range of motion. Neck supple.  Cardiovascular: Normal rate.   Pulmonary/Chest: Effort normal. No respiratory distress.  Abdominal: Soft. She exhibits no distension. There is no tenderness.  Genitourinary:  Genitourinary Comments: Pelvic exam.Normal external female genitalia. There is a thin gray discharge oozing from the vaginal orifice. Vaginal vault and walls are covered with thin gray discharge and a thicker creamy your type discharge. No white cottage cheese type discharge and no external evidence of erythema suggesting Candida at this point. The cervix/ectocervix is erythematous and friable. No CMT or adnexal tenderness. Mara, rad tech present during pelvic exam  Musculoskeletal: Normal range of motion. She exhibits no edema.  Neurological: She is alert and oriented to person, place, and time. No cranial nerve deficit. She exhibits normal muscle tone.  Skin: Skin is warm and dry.  Psychiatric: She has a normal mood and affect.  Nursing  note and vitals reviewed.   Urgent Care Course     Procedures (including critical care time)  Labs Review Labs Reviewed  CERVICOVAGINAL ANCILLARY ONLY   Results for orders placed or performed during the hospital encounter of 06/27/16  Pregnancy, urine POC  Result Value Ref Range   Preg Test, Ur NEGATIVE NEGATIVE     Imaging Review No results found.   Visual Acuity Review  Right Eye Distance:   Left Eye Distance:   Bilateral Distance:    Right  Eye Near:   Left Eye Near:    Bilateral Near:         MDM   1. Vaginal discharge   2. BV (bacterial vaginosis)   3. Cervicitis    You are being treated for bacterial vaginosis and inflammation of the cervix which may be caused by the vaginosis or possibly another infection such as chlamydia. Take the medication as directed. Results of these tests should be back in one to 2 days. If there are any positives that need to be treated this likely can be treated over the telephone. Meds ordered this encounter  Medications  . azithromycin (ZITHROMAX) tablet 1,000 mg  . metroNIDAZOLE (FLAGYL) 500 MG tablet    Sig: Take 1 tablet (500 mg total) by mouth 2 (two) times daily. X 7 days    Dispense:  14 tablet    Refill:  0    Order Specific Question:   Supervising Provider    Answer:   Elvina SidleLAUENSTEIN, KURT [[id number]]       Hayden RasmussenMabe, Willmer Fellers, NP 06/27/16 1349

## 2016-06-28 LAB — CERVICOVAGINAL ANCILLARY ONLY
BACTERIAL VAGINITIS: NEGATIVE
Candida vaginitis: NEGATIVE
Chlamydia: NEGATIVE
Neisseria Gonorrhea: NEGATIVE
Trichomonas: POSITIVE — AB

## 2016-08-08 ENCOUNTER — Ambulatory Visit (INDEPENDENT_AMBULATORY_CARE_PROVIDER_SITE_OTHER): Payer: 59 | Admitting: *Deleted

## 2016-08-08 VITALS — BP 110/70 | HR 80

## 2016-08-08 DIAGNOSIS — Z3042 Encounter for surveillance of injectable contraceptive: Secondary | ICD-10-CM

## 2016-08-08 MED ORDER — MEDROXYPROGESTERONE ACETATE 150 MG/ML IM SUSP
150.0000 mg | Freq: Once | INTRAMUSCULAR | Status: AC
  Administered 2016-08-08: 150 mg via INTRAMUSCULAR

## 2016-10-28 ENCOUNTER — Ambulatory Visit: Payer: 59

## 2017-03-15 ENCOUNTER — Ambulatory Visit (HOSPITAL_COMMUNITY)
Admission: EM | Admit: 2017-03-15 | Discharge: 2017-03-15 | Disposition: A | Discharge status: Home / Self Care | Payer: 59 | Attending: Family Medicine | Admitting: Family Medicine

## 2017-03-15 ENCOUNTER — Other Ambulatory Visit: Payer: Self-pay

## 2017-03-15 ENCOUNTER — Encounter (HOSPITAL_COMMUNITY): Payer: Self-pay | Admitting: Emergency Medicine

## 2017-03-15 DIAGNOSIS — J111 Influenza due to unidentified influenza virus with other respiratory manifestations: Secondary | ICD-10-CM

## 2017-03-15 DIAGNOSIS — R69 Illness, unspecified: Secondary | ICD-10-CM

## 2017-03-15 MED ORDER — IBUPROFEN 800 MG PO TABS: 800.0000 mg | ORAL_TABLET | Freq: Three times a day (TID) | ORAL | 0 refills | Status: DC

## 2017-03-15 MED ORDER — OSELTAMIVIR PHOSPHATE 75 MG PO CAPS: 75.0000 mg | ORAL_CAPSULE | Freq: Two times a day (BID) | ORAL | 0 refills | Status: DC

## 2017-03-15 NOTE — ED Triage Notes (Signed)
Pt c/o fevers, generalized body aches, nasal congestion, sore throat since yesterday.

## 2017-03-15 NOTE — ED Provider Notes (Signed)
[facility]   [id number] 03/15/17 Arrival Time: 1929   SUBJECTIVE:  Elaine Stewart is a 22 y.o. female who presents to the urgent care with complaint of fevers, generalized body aches, nasal congestion, sore throat since yesterday.   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  Also going to school at [employer]  Past Medical History:  Diagnosis Date  . Acne 09/27/2010  . Arthritis   . Asthma affecting pregnancy, antepartum 05/07/2014  . BV (bacterial vaginosis) 01/13/2014  . GBS (group B streptococcus) UTI complicating pregnancy 01/13/2014   History reviewed. No pertinent family history. Social History   Socioeconomic History  . Marital status: Single    Spouse name: Not on file  . Number of children: 1  . Years of education: 12+  . Highest education level: Not on file  Social Needs  . Financial resource strain: Not on file  . Food insecurity - worry: Not on file  . Food insecurity - inability: Not on file  . Transportation needs - medical: Not on file  . Transportation needs - non-medical: Not on file  Occupational History  . Occupation: Conservation officer, nature  . Occupation: child care  . Occupation: Naval architect  Tobacco Use  . Smoking status: Never Smoker  . Smokeless tobacco: Never Used  Substance and Sexual Activity  . Alcohol use: No  . Drug use: No  . Sexual activity: Not Currently    Partners: Male    Birth control/protection: Abstinence  Other Topics Concern  . Not on file  Social History Narrative   Student at [employer]. Mostly online courses.   Lives with her mother and her son (07/2014).   No outpatient medications have been marked as taking for the 03/15/17 encounter [facility] Encounter).   Allergies  Allergen Reactions  . Albuterol Sulfate   . Penicillins       ROS: As per HPI, remainder of ROS negative.   OBJECTIVE:   Vitals:   03/15/17 2029  BP: 124/88  Pulse: (!) 117  Resp: 18  Temp: (!) 100.5 F (38.1 C)  TempSrc: Oral  SpO2: 100%     General  appearance: alert; no distress Eyes: PERRL; EOMI; conjunctiva normal HENT: normocephalic; atraumatic; TMs normal, canal normal, external ears normal without trauma; nasal mucosa normal; oral mucosa normal Neck: supple Lungs: clear to auscultation bilaterally Heart: regular rate and rhythm Back: no CVA tenderness Extremities: no cyanosis or edema; symmetrical with no gross deformities Skin: warm and dry Neurologic: normal gait; grossly normal Psychological: alert and cooperative; normal mood and affect      Labs:  Results for orders placed or performed during the hospital encounter of 06/27/16  Pregnancy, urine POC  Result Value Ref Range   Preg Test, Ur NEGATIVE NEGATIVE  Cervicovaginal ancillary only  Result Value Ref Range   Bacterial vaginitis Negative for Bacterial Vaginitis Microorganisms    Candida vaginitis Negative for Candida species    Chlamydia Negative    Neisseria gonorrhea Negative    Trichomonas **POSITIVE** (A)     Labs Reviewed - No data to display  No results found.     ASSESSMENT & PLAN:  1. Influenza-like illness     Meds ordered this encounter  Medications  . oseltamivir (TAMIFLU) 75 MG capsule    Sig: Take 1 capsule (75 mg total) by mouth every 12 (twelve) hours.    Dispense:  10 capsule    Refill:  0  . ibuprofen (ADVIL,MOTRIN) 800 MG tablet    Sig: Take 1  tablet (800 mg total) by mouth 3 (three) times daily.    Dispense:  21 tablet    Refill:  0    Reviewed expectations re: course of current medical issues. Questions answered. Outlined signs and symptoms indicating need for more acute intervention. Patient verbalized understanding. After Visit Summary given.    Procedures:      Elvina SidleLauenstein, Brock Mokry, MD 03/15/17 2034

## 2017-03-15 NOTE — ED Notes (Signed)
Pt does not want to be swabbed for strep

## 2017-04-17 ENCOUNTER — Ambulatory Visit: Payer: 59 | Admitting: Advanced Practice Midwife

## 2017-04-24 ENCOUNTER — Ambulatory Visit (HOSPITAL_COMMUNITY)
Admission: EM | Admit: 2017-04-24 | Discharge: 2017-04-24 | Discharge status: Absent without leave | Payer: 59 | Source: Home / Self Care

## 2017-04-24 ENCOUNTER — Emergency Department (HOSPITAL_COMMUNITY)
Admission: EM | Admit: 2017-04-24 | Discharge: 2017-04-24 | Disposition: A | Discharge status: Home / Self Care | Payer: 59 | Attending: Emergency Medicine | Admitting: Emergency Medicine

## 2017-04-24 ENCOUNTER — Encounter (HOSPITAL_COMMUNITY): Payer: Self-pay | Admitting: Emergency Medicine

## 2017-04-24 DIAGNOSIS — F419 Anxiety disorder, unspecified: Secondary | ICD-10-CM

## 2017-04-24 DIAGNOSIS — Z79899 Other long term (current) drug therapy: Secondary | ICD-10-CM | POA: Diagnosis not present

## 2017-04-24 DIAGNOSIS — R079 Chest pain, unspecified: Secondary | ICD-10-CM | POA: Diagnosis present

## 2017-04-24 LAB — CBC WITH DIFFERENTIAL/PLATELET
Basophils Absolute: 0 10*3/uL (ref 0.0–0.1)
Basophils Relative: 0 %
Eosinophils Absolute: 0.1 10*3/uL (ref 0.0–0.7)
Eosinophils Relative: 2 %
HCT: 35 % — ABNORMAL LOW (ref 36.0–46.0)
Hemoglobin: 11.8 g/dL — ABNORMAL LOW (ref 12.0–15.0)
Lymphocytes Relative: 44 %
Lymphs Abs: 2.2 10*3/uL (ref 0.7–4.0)
MCH: 29.1 pg (ref 26.0–34.0)
MCHC: 33.7 g/dL (ref 30.0–36.0)
MCV: 86.2 fL (ref 78.0–100.0)
Monocytes Absolute: 0.1 10*3/uL (ref 0.1–1.0)
Monocytes Relative: 3 %
Neutro Abs: 2.6 10*3/uL (ref 1.7–7.7)
Neutrophils Relative %: 51 %
Platelets: 295 10*3/uL (ref 150–400)
RBC: 4.06 MIL/uL (ref 3.87–5.11)
RDW: 13.8 % (ref 11.5–15.5)
WBC: 5 10*3/uL (ref 4.0–10.5)

## 2017-04-24 LAB — BASIC METABOLIC PANEL
Anion gap: 9 (ref 5–15)
BUN: 9 mg/dL (ref 6–20)
CO2: 24 mmol/L (ref 22–32)
Calcium: 9.2 mg/dL (ref 8.9–10.3)
Chloride: 108 mmol/L (ref 101–111)
Creatinine, Ser: 0.66 mg/dL (ref 0.44–1.00)
GFR calc Af Amer: >60 mL/min (ref >60)
GFR calc non Af Amer: >60 mL/min (ref >60)
Glucose, Bld: 85 mg/dL (ref 65–99)
Potassium: 3.4 mmol/L — ABNORMAL LOW (ref 3.5–5.1)
Sodium: 141 mmol/L (ref 135–145)

## 2017-04-24 NOTE — ED Provider Notes (Signed)
[facility]   CSN: [id number] Arrival date & time: 04/24/17  1105     History   Chief Complaint Chief Complaint  Patient presents with  . Chest Pain    HPI Elaine Stewart is a 22 y.o. female.  HPI   22 year old female presents today with complaints of abnormal food exposure.  Patient reports that she was at [employer] this morning.  She was drinking lemonade she notes she had a sip of her lemonade and thought that it might have tasted off.  She notes eating her meal and having another set up and noted again that it did not quite taste normal.  She notes she became anxious about this, started having tightness in her chest and felt like she could not keep her eyes open.  Patient notes that symptoms have completely resolved, she is feeling much improved.  She reports she brought the drink with her and notes that it smells funny.  No episodes of vomiting, no other complaints here today.  Past Medical History:  Diagnosis Date  . Acne 09/27/2010  . Arthritis   . Asthma affecting pregnancy, antepartum 05/07/2014  . BV (bacterial vaginosis) 01/13/2014  . GBS (group B streptococcus) UTI complicating pregnancy 01/13/2014    Patient Active Problem List   Diagnosis Date Noted  . Anemia 12/05/2015  . 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 Rhinitis, allergic 09/29/2010  . Hypertrophy of tonsils and adenoids 09/27/2010    Past Surgical History:  Procedure Laterality Date  . NO PAST SURGERIES       OB History    Gravida  2   Para      Term      Preterm      AB      Living        SAB      TAB      Ectopic      Multiple      Live Births               Home Medications    Prior to Admission medications   Medication Sig Start Date End Date Taking? Authorizing Provider  ibuprofen (ADVIL,MOTRIN) 800 MG tablet Take 1 tablet (800 mg total) by mouth 3 (three) times daily. 03/15/17    Elvina SidleLauenstein, Kurt, MD  oseltamivir (TAMIFLU) 75 MG capsule Take 1 capsule (75 mg total) by mouth every 12 (twelve) hours. 03/15/17   Elvina SidleLauenstein, Kurt, MD    Family History History reviewed. No pertinent family history.  Social History Social History   Tobacco Use  . Smoking status: Never Smoker  . Smokeless tobacco: Never Used  Substance Use Topics  . Alcohol use: No  . Drug use: No     Allergies   Albuterol sulfate and Penicillins   Review of Systems Review of Systems  All other systems reviewed and are negative.    Physical Exam Updated Vital Signs BP (!) 105/57 (BP Location: Right Arm)   Pulse 65   Temp 98.9 F (37.2 C) (Oral)   Resp 18   LMP 04/23/2017   Physical Exam  Constitutional: She is oriented to person, place, and time. She appears well-developed and well-nourished.  HENT:  Head: Normocephalic and atraumatic.  Eyes: Pupils are equal, round, and reactive to light. Conjunctivae are normal. Right eye exhibits no discharge. Left eye exhibits no discharge. No scleral icterus.  Neck: Normal range of motion. No JVD present. No tracheal deviation present.  Cardiovascular: Normal rate, regular rhythm, normal heart sounds and intact distal pulses. Exam reveals no gallop and no friction rub.  No murmur heard. Pulmonary/Chest: Effort normal and breath sounds normal. No stridor. No respiratory distress. She has no wheezes. She has no rales. She exhibits no tenderness.  Abdominal: Soft. She exhibits no distension and no mass. There is no tenderness. There is no rebound and no guarding. No hernia.  Neurological: She is alert and oriented to person, place, and time. Coordination normal.  Psychiatric: She has a normal mood and affect. Her behavior is normal. Judgment and thought content normal.  Nursing note and vitals reviewed.    ED Treatments / Results  Labs (all labs ordered are listed, but only abnormal results are displayed) Labs Reviewed  CBC WITH  DIFFERENTIAL/PLATELET - Abnormal; Notable for the following components:      Result Value   Hemoglobin 11.8 (*)    HCT 35.0 (*)    All other components within normal limits  BASIC METABOLIC PANEL - Abnormal; Notable for the following components:   Potassium 3.4 (*)    All other components within normal limits    EKG None  ED ECG REPORT   Date: 04/24/2017  Rate: 84  Rhythm: normal sinus rhythm  QRS Axis: normal  Intervals: normal  ST/T Wave abnormalities: normal  Conduction Disutrbances:none  Narrative Interpretation:   Old EKG Reviewed: none available  I have personally reviewed the EKG tracing and agree with the computerized printout as noted.   Radiology No results found.  Procedures Procedures (including critical care time)  Medications Ordered in ED Medications - No data to display   Initial Impression / Assessment and Plan / ED Course  I have reviewed the triage vital signs and the nursing notes.  Pertinent labs & imaging results that were available during my care of the patient were reviewed by me and considered in my medical decision making (see chart for details).      Final Clinical Impressions(s) / ED Diagnoses   Final diagnoses:  Anxiety    Labs: CBC, BMP  Imaging:  Consults:  Therapeutics:  Discharge Meds:   Assessment/Plan: 22 year old female presents today with complaints of abnormal drink exposure.  Patient is well-appearing in no acute distress.  She has no symptoms at the time of my evaluation.  Patient has reassuring laboratory analysis with minor hypokalemia, encouraged to increase potassium rich foods.  Patient did have the drink with her, I could not appreciate any abnormal odor coming from the drain.  Patient drank a very small amount of this beverage I have low suspicion for any acute life-threatening chemical exposure.  Patient will follow-up immediately emergency room she develops any new or worsening signs or symptoms.  Patient  had no further questions or concerns at the time of discharge.    ED Discharge Orders    None       Rosalio Loud 04/24/17 1332    Margarita Grizzle, MD 04/24/17 [id number]

## 2017-04-24 NOTE — ED Triage Notes (Signed)
Pt to ER states she believes she ingested "chemicals" from [employer]. Pt in NAD. Reports chest tightness.

## 2017-04-24 NOTE — Discharge Instructions (Addendum)
Please read attached information. If you experience any new or worsening signs or symptoms please return to the emergency room for evaluation. Please follow-up with your primary care provider or specialist as discussed.  °

## 2017-04-24 NOTE — ED Notes (Signed)
Amy yu, pa aware of patient complaint  Recommended ED for appropriate treatment.    Says drink did not taste right.  Consumed one hour ago and has had difficulty keeping eyes ope and feeling sob per patient.  Patient alert, making eye contact, answering in multiple and complete sentences.

## 2017-05-10 ENCOUNTER — Ambulatory Visit (HOSPITAL_COMMUNITY)
Admission: EM | Admit: 2017-05-10 | Discharge: 2017-05-10 | Disposition: A | Discharge status: Home / Self Care | Payer: 59 | Attending: Family Medicine | Admitting: Family Medicine

## 2017-05-10 ENCOUNTER — Encounter (HOSPITAL_COMMUNITY): Payer: Self-pay | Admitting: Emergency Medicine

## 2017-05-10 DIAGNOSIS — N760 Acute vaginitis: Secondary | ICD-10-CM | POA: Insufficient documentation

## 2017-05-10 DIAGNOSIS — N898 Other specified noninflammatory disorders of vagina: Secondary | ICD-10-CM | POA: Diagnosis present

## 2017-05-10 LAB — POCT URINALYSIS DIP (DEVICE)
Glucose, UA: 100 mg/dL — AB
KETONES UR: NEGATIVE mg/dL
Leukocytes, UA: NEGATIVE
Nitrite: NEGATIVE
PH: 6 (ref 5.0–8.0)
PROTEIN: 30 mg/dL — AB
Urobilinogen, UA: 0.2 mg/dL (ref 0.0–1.0)

## 2017-05-10 LAB — POCT PREGNANCY, URINE: PREG TEST UR: NEGATIVE

## 2017-05-10 MED ORDER — METRONIDAZOLE 500 MG PO TABS: 500.0000 mg | ORAL_TABLET | Freq: Two times a day (BID) | ORAL | 0 refills | Status: AC

## 2017-05-10 NOTE — Discharge Instructions (Signed)
Complete course of antibiotics.   Do not drink alcohol while taking. Please withhold from intercourse for the next week. Please use condoms to prevent STD's.   We are testing your urine for yeast, bacteria and std's. Will notify you of any positive findings and if any changes to treatment are needed.   This appears consistent with bacterial vaginosis which we will start treatment for. If symptoms worsen or do not improve in the next week to return to be seen or to follow up with your PCP.

## 2017-05-10 NOTE — ED Triage Notes (Signed)
Pt sts vaginal discharge and irritation 

## 2017-05-10 NOTE — ED Provider Notes (Signed)
[facility]    CSN: [id number] Arrival date & time: 05/10/17  1214     History   Chief Complaint Chief Complaint  Patient presents with  . Vaginal Discharge    HPI Elaine Stewart is a 22 y.o. female.   Elaine Stewart presents with complaints of vaginal discharge with odor which feels irritating which she noticed last night. No itching, pain, sores, ulcerations or lesions. Denies pelvic or abdominal pain. Denies urinary symptoms. She is sexually active, uses condoms. Last period last week. She is not on birth control. Denies concern for STD, states she was recently checked as well. States feels somewhat similar to BV she has had in the past, was years ago.    ROS per HPI.      Past Medical History:  Diagnosis Date  . Acne 09/27/2010  . Arthritis   . Asthma affecting pregnancy, antepartum 05/07/2014  . BV (bacterial vaginosis) 01/13/2014  . GBS (group B streptococcus) UTI complicating pregnancy 01/13/2014    Patient Active Problem List   Diagnosis Date Noted  . Anemia 12/05/2015  . 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 Rhinitis, allergic 09/29/2010  . Hypertrophy of tonsils and adenoids 09/27/2010    Past Surgical History:  Procedure Laterality Date  . NO PAST SURGERIES      OB History    Gravida  2   Para      Term      Preterm      AB      Living        SAB      TAB      Ectopic      Multiple      Live Births               Home Medications    Prior to Admission medications   Medication Sig Start Date End Date Taking? Authorizing Provider  ibuprofen (ADVIL,MOTRIN) 800 MG tablet Take 1 tablet (800 mg total) by mouth 3 (three) times daily. 03/15/17   Elvina Sidle, MD  metroNIDAZOLE (FLAGYL) 500 MG tablet Take 1 tablet (500 mg total) by mouth 2 (two) times daily for 7 days. 05/10/17 05/17/17  Georgetta Haber, NP    Family History History reviewed. No pertinent family history.  Social History Social  History   Tobacco Use  . Smoking status: Never Smoker  . Smokeless tobacco: Never Used  Substance Use Topics  . Alcohol use: No  . Drug use: No     Allergies   Albuterol sulfate and Penicillins   Review of Systems Review of Systems   Physical Exam Triage Vital Signs ED Triage Vitals [05/10/17 1224]  Enc Vitals Group     BP 112/68     Pulse Rate 78     Resp 16     Temp 98.5 F (36.9 C)     Temp Source Oral     SpO2 100 %     Weight      Height      Head Circumference      Peak Flow      Pain Score      Pain Loc      Pain Edu?      Excl. in GC?    No data found.  Updated Vital Signs BP 112/68 (BP Location: Right Arm)   Pulse 78   Temp 98.5 F (36.9 C) (Oral)   Resp 16   LMP 04/23/2017   SpO2 100%  Physical Exam  Constitutional: She is oriented to person, place, and time. She appears well-developed and well-nourished. No distress.  Cardiovascular: Normal rate, regular rhythm and normal heart sounds.  Pulmonary/Chest: Effort normal and breath sounds normal.  Abdominal: Soft. Bowel sounds are normal. There is no tenderness. There is no CVA tenderness.  Genitourinary:  Genitourinary Comments: Denies itching, pain, ulcerations, sores, vaginal bleeding, GU exam deferred at this time, urine cytology pending  Neurological: She is alert and oriented to person, place, and time.  Skin: Skin is warm and dry.     UC Treatments / Results  Labs (all labs ordered are listed, but only abnormal results are displayed) Labs Reviewed  POCT URINALYSIS DIP (DEVICE) - Abnormal; Notable for the following components:      Result Value   Glucose, UA 100 (*)    Bilirubin Urine SMALL (*)    Hgb urine dipstick MODERATE (*)    Protein, ur 30 (*)    All other components within normal limits  POCT PREGNANCY, URINE  URINE CYTOLOGY ANCILLARY ONLY    EKG None  Radiology No results found.  Procedures Procedures (including critical care time)  Medications Ordered in  UC Medications - No data to display  Initial Impression / Assessment and Plan / UC Course  I have reviewed the triage vital signs and the nursing notes.  Pertinent labs & imaging results that were available during my care of the patient were reviewed by me and considered in my medical decision making (see chart for details).     Consistent with BV, flagyl initiated, urine cytology pending. Will notify of any positive findings and if any changes to treatment are needed.  Patient verbalized understanding and agreeable to plan.      Final Clinical Impressions(s) / UC Diagnoses   Final diagnoses:  Acute vaginitis     Discharge Instructions     Complete course of antibiotics.   Do not drink alcohol while taking. Please withhold from intercourse for the next week. Please use condoms to prevent STD's.   We are testing your urine for yeast, bacteria and std's. Will notify you of any positive findings and if any changes to treatment are needed.   This appears consistent with bacterial vaginosis which we will start treatment for. If symptoms worsen or do not improve in the next week to return to be seen or to follow up with your PCP.     ED Prescriptions    Medication Sig Dispense Auth. Provider   metroNIDAZOLE (FLAGYL) 500 MG tablet Take 1 tablet (500 mg total) by mouth 2 (two) times daily for 7 days. 14 tablet Georgetta Haber, NP     Controlled Substance Prescriptions  Controlled Substance Registry consulted? Not Applicable   Georgetta Haber, NP 05/10/17 1304

## 2017-05-11 LAB — URINE CYTOLOGY ANCILLARY ONLY
CHLAMYDIA, DNA PROBE: NEGATIVE
Neisseria Gonorrhea: NEGATIVE
TRICH (WINDOWPATH): NEGATIVE

## 2017-05-12 ENCOUNTER — Telehealth (HOSPITAL_COMMUNITY): Payer: Self-pay

## 2017-05-12 NOTE — Telephone Encounter (Signed)
Results are within normal range. Pt contacted and made aware. Verbalized understanding.   

## 2017-05-13 LAB — URINE CYTOLOGY ANCILLARY ONLY: Candida vaginitis: NEGATIVE

## 2017-05-15 ENCOUNTER — Telehealth (HOSPITAL_COMMUNITY): Payer: Self-pay

## 2017-05-15 NOTE — Telephone Encounter (Signed)
Bacterial Vaginosis test is positive.  Prescription for metronidazole was given at the urgent care visit. Attempted to reach patient. No answer at this time. 

## 2017-06-10 ENCOUNTER — Emergency Department (HOSPITAL_COMMUNITY)
Admission: EM | Admit: 2017-06-10 | Discharge: 2017-06-11 | Disposition: A | Discharge status: Home / Self Care | Payer: 59 | Attending: Emergency Medicine | Admitting: Emergency Medicine

## 2017-06-10 ENCOUNTER — Encounter (HOSPITAL_COMMUNITY): Payer: Self-pay | Admitting: Emergency Medicine

## 2017-06-10 ENCOUNTER — Emergency Department (HOSPITAL_COMMUNITY): Payer: 59

## 2017-06-10 ENCOUNTER — Other Ambulatory Visit: Payer: Self-pay

## 2017-06-10 ENCOUNTER — Ambulatory Visit (HOSPITAL_COMMUNITY)
Admission: EM | Admit: 2017-06-10 | Discharge: 2017-06-10 | Disposition: A | Discharge status: Home / Self Care | Payer: 59 | Source: Home / Self Care

## 2017-06-10 ENCOUNTER — Encounter (HOSPITAL_COMMUNITY): Payer: Self-pay

## 2017-06-10 DIAGNOSIS — R51 Headache: Secondary | ICD-10-CM | POA: Diagnosis present

## 2017-06-10 DIAGNOSIS — M542 Cervicalgia: Secondary | ICD-10-CM | POA: Diagnosis not present

## 2017-06-10 DIAGNOSIS — H5710 Ocular pain, unspecified eye: Secondary | ICD-10-CM | POA: Diagnosis not present

## 2017-06-10 DIAGNOSIS — R110 Nausea: Secondary | ICD-10-CM | POA: Diagnosis not present

## 2017-06-10 DIAGNOSIS — H53149 Visual discomfort, unspecified: Secondary | ICD-10-CM | POA: Insufficient documentation

## 2017-06-10 DIAGNOSIS — H9209 Otalgia, unspecified ear: Secondary | ICD-10-CM | POA: Insufficient documentation

## 2017-06-10 DIAGNOSIS — J45909 Unspecified asthma, uncomplicated: Secondary | ICD-10-CM | POA: Insufficient documentation

## 2017-06-10 DIAGNOSIS — R519 Headache, unspecified: Secondary | ICD-10-CM

## 2017-06-10 MED ORDER — IBUPROFEN 800 MG PO TABS
800.0000 mg | ORAL_TABLET | Freq: Once | ORAL | Status: AC
  Administered 2017-06-10: 800 mg via ORAL
  Filled 2017-06-10: qty 1

## 2017-06-10 NOTE — ED Triage Notes (Signed)
Pt presents today with headache and pressure behind eyes and blurred vision. States it feels different than a regular migraine and has experienced migraines before. States the pain can be unexplainable.

## 2017-06-10 NOTE — ED Notes (Signed)
Mosetta PuttFeng NP evaluated patient and pt was sent to the er.

## 2017-06-10 NOTE — ED Provider Notes (Signed)
[facility] EMERGENCY DEPARTMENT Provider Note  CSN: [id number] Arrival date & time: 06/10/17  1944  History   Chief Complaint Chief Complaint  Patient presents with  . Headache   HPI Elaine Stewart is a 22 y.o. female with a medical history of allergic rhinitis, juvenile RA and migraines who presented to the ED for headache x1 month. Patient describes pain as pressure in the front part of her head and behind her eyes. She states "it feels like a balloon is filling up." Associated symptoms: nausea, otalgia, eye pain, eye tearing, photophobia and neck pain. Denies recent sick contacts or new exposures. Traveled to [place] prior to this. She states that she has had migraines in the past and that this feels different from a migraine. Patient has tried ibuprofen prior to coming to the ED which has provided relief.  Past Medical History:  Diagnosis Date  . Acne 09/27/2010  . Arthritis   . Asthma affecting pregnancy, antepartum 05/07/2014  . BV (bacterial vaginosis) 01/13/2014  . GBS (group B streptococcus) UTI complicating pregnancy 01/13/2014    Patient Active Problem List   Diagnosis Date Noted  . Anemia 12/05/2015  . 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 Rhinitis, allergic 09/29/2010  . Hypertrophy of tonsils and adenoids 09/27/2010    Past Surgical History:  Procedure Laterality Date  . NO PAST SURGERIES       OB History    Gravida  2   Para      Term      Preterm      AB      Living        SAB      TAB      Ectopic      Multiple      Live Births               Home Medications    Prior to Admission medications   Medication Sig Start Date End Date Taking? Authorizing Provider  ibuprofen (ADVIL,MOTRIN) 800 MG tablet Take 1 tablet (800 mg total) by mouth every 8 (eight) hours as needed for headache. 06/11/17 07/11/17  Mortis, Jerrel Ivory I, PA-C  neomycin-polymyxin-hydrocortisone (CORTISPORIN) 3.5-10000-1  OTIC suspension Place 3 drops into the left ear 3 (three) times daily for 3 days. 06/11/17 06/14/17  Mortis, Sharyon Medicus, PA-C    Family History No family history on file.  Social History Social History   Tobacco Use  . Smoking status: Never Smoker  . Smokeless tobacco: Never Used  Substance Use Topics  . Alcohol use: No  . Drug use: No     Allergies   Penicillins   Review of Systems Review of Systems  Constitutional: Positive for appetite change. Negative for chills, diaphoresis and fever.  HENT: Negative for sinus pressure and sinus pain.   Eyes: Positive for photophobia, pain and discharge.  Respiratory: Negative.   Cardiovascular: Negative.   Gastrointestinal: Negative.   Musculoskeletal: Positive for neck pain.  Skin: Negative for rash.  Neurological: Positive for headaches. Negative for dizziness and light-headedness.  Psychiatric/Behavioral: Negative for confusion, decreased concentration and sleep disturbance.     Physical Exam Updated Vital Signs BP 100/64   Pulse (!) 50   Temp 98.3 F (36.8 C) (Oral)   Resp 14   Ht 5\' 2"  (1.575 m)   Wt 54.4 kg (120 lb)   SpO2 100%   BMI 21.95 kg/m   Physical Exam  Constitutional: Vital signs are normal. She appears  well-developed and well-nourished. She is cooperative. She does not appear ill. No distress.  HENT:  Head: Normocephalic and atraumatic. Head is without right periorbital erythema and without left periorbital erythema.  Right Ear: Hearing, tympanic membrane, external ear and ear canal normal.  Left Ear: Hearing, tympanic membrane and external ear normal.  Nose: Right sinus exhibits no maxillary sinus tenderness and no frontal sinus tenderness. Left sinus exhibits no maxillary sinus tenderness and no frontal sinus tenderness.  Mouth/Throat: Uvula is midline, oropharynx is clear and moist and mucous membranes are normal.  Some redness seen in left ear canal. No edema, effusion or exudate seen. Discomfort when  otoscope placed in ear.  Eyes: Pupils are equal, round, and reactive to light. Conjunctivae and lids are normal. Right conjunctiva is not injected. Left conjunctiva is not injected.  EOMs limited due to pain at points of extreme gaze.  Neck: Trachea normal and phonation normal. No Brudzinski's sign and no Kernig's sign noted.  Neck tender to palpation. Pain with active ROM.  Cardiovascular: Normal rate, regular rhythm, normal heart sounds and intact distal pulses.  Pulmonary/Chest: Effort normal and breath sounds normal.  Neurological: She is alert. She has normal strength and normal reflexes. No cranial nerve deficit or sensory deficit. She exhibits normal muscle tone.  Skin: Skin is warm and intact. No rash noted.  Psychiatric: Her speech is normal. Her mood appears anxious. Cognition and memory are normal.  Nursing note and vitals reviewed.    ED Treatments / Results  Labs (all labs ordered are listed, but only abnormal results are displayed) Labs Reviewed  POC URINE PREG, ED   EKG None  Radiology Ct Orbits Wo Contrast  Result Date: 06/11/2017 CLINICAL DATA:  Initial evaluation for acute headache, pressure behind eyes. Blurry vision. History of migraines. EXAM: CT ORBITS WITHOUT CONTRAST TECHNIQUE: Multidetector CT images were obtained using the standard protocol without intravenous contrast. COMPARISON:  None available. FINDINGS: Orbits: Globes symmetric in size with normal appearance and morphology bilaterally. Optic nerves symmetric and within normal limits bilaterally. Extra-ocular muscles within normal limits bilaterally. Superior orbital veins symmetric and normal. Normal lacrimal glands. Intraconal and extraconal fat well-maintained. No abnormality at the orbital apices. Cavernous sinus grossly unremarkable in this noncontrast examination. Visualized sinuses: Mild mucosal thickening within the maxillary sinuses bilaterally. Visualized paranasal sinuses are otherwise clear.  Periapical lucency noted at a right maxillary cuspid (series 4, image 1), incompletely visualized. No mastoid effusion. Middle ear cavities are clear. Soft tissues: Periorbital soft tissues within normal limits. Remainder the visualized soft tissues of the face demonstrate no acute abnormality. Limited intracranial: Unremarkable. IMPRESSION: 1. Normal CT of the orbits. No structural findings to explain patient's symptoms identified. 2. Periapical lucency at a right maxillary cuspid, incompletely visualized. Nonemergent outpatient dental referral recommended. Electronically Signed   By: Rise Mu M.D.   On: 06/11/2017 01:19    Procedures Procedures (including critical care time)  Medications Ordered in ED Medications  ibuprofen (ADVIL,MOTRIN) tablet 800 mg (800 mg Oral Given 06/10/17 2337)  neomycin-polymyxin-hydrocortisone (CORTISPORIN) OTIC (EAR) suspension 3 drop (3 drops Left EAR Given 06/11/17 0044)    Initial Impression / Assessment and Plan / ED Course  Triage vital signs and the nursing notes have been reviewed.  Pertinent labs & imaging results that were available during care of the patient were reviewed and considered in medical decision making (see chart for details).  Patient presents in no acute distress and well appearing. Denies systemic s/s, focal neuro deficits and has no  meningeal signs to suggest an infectious etiology, acute intracranial process or meningitis or encephalopathy. Physical exam reveals painful EOMs, eye tearing and increased headache when lying supine which prompted CT scan.  Clinical Course as of Jun 11 1732  Sat Jun 10, 2017  0111 CT scan was normal. No sinus or orbit abnormalities identified that could be causing pt's symptoms.    [GM]  2257 Ibuprofen 800mg  x1 and Cortisporin ear drops given for headache and otalgia.   [GM]    Clinical Course User Index [GM] Mortis, Sharyon MedicusGabrielle I, PA-C   Headache most likely sinus in origin due to the description  of fullness and pressure behind eyes and in face plus otalgia. Education provided on OTC and supportive treatments for sinus headaches.   Final Clinical Impressions(s) / ED Diagnoses  1. Headache. Likely sinus in origin. Education provided. May continue NSAIDs for pain relief. Rx for ibuprofen 800mg  Q8H PRN given. Referral to neurology if pt continues to have headaches > 6 weeks. May continue otic drops for pain relief x3 days.  Dispo: Home. After thorough clinical evaluation, this patient is determined to be medically stable and can be safely discharged with the previously mentioned treatment and/or outpatient follow-up/referral(s). At this time, there are no other apparent medical conditions that require further screening, evaluation or treatment.  Final diagnoses:  Acute nonintractable headache, unspecified headache type    ED Discharge Orders        Ordered    ibuprofen (ADVIL,MOTRIN) 800 MG tablet  Every 8 hours PRN     06/11/17 0103    neomycin-polymyxin-hydrocortisone (CORTISPORIN) 3.5-10000-1 OTIC suspension  3 times daily     06/11/17 0103        Mortis, [place] I, PA-C 06/11/17 1735    Gerhard MunchLockwood, Robert, MD 06/12/17 2108

## 2017-06-10 NOTE — ED Triage Notes (Signed)
Reports headache on and off for a couple of months.  Reports more consistent lately.  Describes as pressure like there is a balloon in there.  Hx of migraines.  Also endorses nausea and photosensitivity.  Reports taking ibuprofen with some relief but stopped taking it two weeks ago.

## 2017-06-11 DIAGNOSIS — R51 Headache: Secondary | ICD-10-CM | POA: Diagnosis not present

## 2017-06-11 MED ORDER — IBUPROFEN 800 MG PO TABS
800.0000 mg | ORAL_TABLET | Freq: Three times a day (TID) | ORAL | 0 refills | Status: AC | PRN
Start: 2017-06-11 — End: 2017-07-11

## 2017-06-11 MED ORDER — NEOMYCIN-POLYMYXIN-HC 3.5-10000-1 OT SUSP
3.0000 [drp] | Freq: Once | OTIC | Status: AC
  Administered 2017-06-11: 3 [drp] via OTIC
  Filled 2017-06-11: qty 10

## 2017-06-11 MED ORDER — NEOMYCIN-POLYMYXIN-HC 3.5-10000-1 OT SUSP: 3.0000 [drp] | Freq: Three times a day (TID) | OTIC | 0 refills | Status: AC

## 2017-06-11 NOTE — Discharge Instructions (Addendum)
You may continue to use ibuprofen for headaches.  I gave you a Rx for the ear drops for you to use as well.

## 2017-06-12 LAB — POC URINE PREG, ED: PREG TEST UR: NEGATIVE

## 2017-06-20 ENCOUNTER — Other Ambulatory Visit: Payer: Self-pay

## 2017-06-20 ENCOUNTER — Encounter (HOSPITAL_COMMUNITY): Payer: Self-pay | Admitting: Emergency Medicine

## 2017-06-20 ENCOUNTER — Emergency Department (HOSPITAL_COMMUNITY)
Admission: EM | Admit: 2017-06-20 | Discharge: 2017-06-20 | Disposition: A | Discharge status: Home / Self Care | Payer: 59 | Attending: Emergency Medicine | Admitting: Emergency Medicine

## 2017-06-20 ENCOUNTER — Emergency Department (HOSPITAL_COMMUNITY): Payer: 59

## 2017-06-20 DIAGNOSIS — J019 Acute sinusitis, unspecified: Secondary | ICD-10-CM | POA: Diagnosis not present

## 2017-06-20 DIAGNOSIS — J329 Chronic sinusitis, unspecified: Secondary | ICD-10-CM

## 2017-06-20 DIAGNOSIS — R509 Fever, unspecified: Secondary | ICD-10-CM | POA: Diagnosis present

## 2017-06-20 LAB — CBC WITH DIFFERENTIAL/PLATELET
ABS IMMATURE GRANULOCYTES: 0 10*3/uL (ref 0.0–0.1)
Basophils Absolute: 0 10*3/uL (ref 0.0–0.1)
Basophils Relative: 0 %
Eosinophils Absolute: 0 10*3/uL (ref 0.0–0.7)
Eosinophils Relative: 0 %
HCT: 34.2 % — ABNORMAL LOW (ref 36.0–46.0)
HEMOGLOBIN: 11.1 g/dL — AB (ref 12.0–15.0)
Immature Granulocytes: 0 %
LYMPHS PCT: 5 %
Lymphs Abs: 0.3 10*3/uL — ABNORMAL LOW (ref 0.7–4.0)
MCH: 29.2 pg (ref 26.0–34.0)
MCHC: 32.5 g/dL (ref 30.0–36.0)
MCV: 90 fL (ref 78.0–100.0)
Monocytes Absolute: 0.5 10*3/uL (ref 0.1–1.0)
Monocytes Relative: 8 %
NEUTROS ABS: 5.3 10*3/uL (ref 1.7–7.7)
NEUTROS PCT: 87 %
PLATELETS: 222 10*3/uL (ref 150–400)
RBC: 3.8 MIL/uL — AB (ref 3.87–5.11)
RDW: 12.6 % (ref 11.5–15.5)
WBC: 6.1 10*3/uL (ref 4.0–10.5)

## 2017-06-20 LAB — URINALYSIS, ROUTINE W REFLEX MICROSCOPIC
Bacteria, UA: NONE SEEN
Bilirubin Urine: NEGATIVE
GLUCOSE, UA: NEGATIVE mg/dL
KETONES UR: NEGATIVE mg/dL
NITRITE: NEGATIVE
PH: 5 (ref 5.0–8.0)
Protein, ur: NEGATIVE mg/dL
Specific Gravity, Urine: 1.013 (ref 1.005–1.030)

## 2017-06-20 LAB — COMPREHENSIVE METABOLIC PANEL
ALK PHOS: 40 U/L (ref 38–126)
ALT: 11 U/L — AB (ref 14–54)
AST: 16 U/L (ref 15–41)
Albumin: 3.6 g/dL (ref 3.5–5.0)
Anion gap: 8 (ref 5–15)
BUN: 6 mg/dL (ref 6–20)
CHLORIDE: 108 mmol/L (ref 101–111)
CO2: 23 mmol/L (ref 22–32)
Calcium: 8.5 mg/dL — ABNORMAL LOW (ref 8.9–10.3)
Creatinine, Ser: 0.83 mg/dL (ref 0.44–1.00)
GFR calc Af Amer: >60 mL/min (ref >60)
GFR calc non Af Amer: >60 mL/min (ref >60)
GLUCOSE: 109 mg/dL — AB (ref 65–99)
Potassium: 3.3 mmol/L — ABNORMAL LOW (ref 3.5–5.1)
SODIUM: 139 mmol/L (ref 135–145)
Total Bilirubin: 0.6 mg/dL (ref 0.3–1.2)
Total Protein: 6.2 g/dL — ABNORMAL LOW (ref 6.5–8.1)

## 2017-06-20 LAB — I-STAT BETA HCG BLOOD, ED (MC, WL, AP ONLY): I-stat hCG, quantitative: <5 m[IU]/mL (ref <5)

## 2017-06-20 LAB — GROUP A STREP BY PCR: Group A Strep by PCR: NOT DETECTED

## 2017-06-20 LAB — I-STAT CG4 LACTIC ACID, ED
LACTIC ACID, VENOUS: 1.93 mmol/L — AB (ref 0.5–1.9)
LACTIC ACID, VENOUS: 1.12 mmol/L (ref 0.5–1.9)

## 2017-06-20 MED ORDER — ACETAMINOPHEN 325 MG PO TABS: 650.0000 mg | ORAL_TABLET | Freq: Once | ORAL | Status: DC | PRN

## 2017-06-20 MED ORDER — AMOXICILLIN-POT CLAVULANATE 875-125 MG PO TABS
1.0000 | ORAL_TABLET | Freq: Once | ORAL | Status: AC
  Administered 2017-06-20: 1 via ORAL
  Filled 2017-06-20: qty 1

## 2017-06-20 MED ORDER — AMOXICILLIN-POT CLAVULANATE 875-125 MG PO TABS: 1.0000 | ORAL_TABLET | Freq: Two times a day (BID) | ORAL | 0 refills | Status: DC

## 2017-06-20 MED ORDER — FLUTICASONE PROPIONATE 50 MCG/ACT NA SUSP: 1.0000 | Freq: Every day | NASAL | 2 refills | Status: DC

## 2017-06-20 NOTE — ED Triage Notes (Signed)
Patient to ED c/o continued ear and head pressure (since last week, but worse today) as well as new fevers, chest wall pain, and SOB. Treated last week for sinus infection. EMS VS: 104F tympanic, HR 122 ST, 118/76, 98% RA, RR 20. 20g. PIV LAC, given 500cc NS PTA. Resp e/u, skin very warm, but dry.

## 2017-06-20 NOTE — ED Provider Notes (Signed)
[facility] EMERGENCY DEPARTMENT Provider Note   CSN: [id number] Arrival date & time: 06/20/17  1737     History   Chief Complaint Chief Complaint  Patient presents with  . Shortness of Breath  . Fever    HPI Elaine Stewart is a 22 y.o. female.  Patient presents to the emergency department with a chief complaint of fever, ear pain, sinus congestion.  She reports having had some chest congestion, but denies any cough.  She reports having had some shortness of breath.  She has not taken anything for her symptoms.  Reports having associated fevers and chills at home with a T-max of 104.  She states the majority of her symptoms are coming from her left ear and sinuses.  The history is provided by the patient. No language interpreter was used.    Past Medical History:  Diagnosis Date  . Acne 09/27/2010  . Arthritis   . Asthma affecting pregnancy, antepartum 05/07/2014  . BV (bacterial vaginosis) 01/13/2014  . GBS (group B streptococcus) UTI complicating pregnancy 01/13/2014    Patient Active Problem List   Diagnosis Date Noted  . Anemia 12/05/2015  . 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 Rhinitis, allergic 09/29/2010  . Hypertrophy of tonsils and adenoids 09/27/2010    Past Surgical History:  Procedure Laterality Date  . NO PAST SURGERIES       OB History    Gravida  2   Para      Term      Preterm      AB      Living        SAB      TAB      Ectopic      Multiple      Live Births               Home Medications    Prior to Admission medications   Medication Sig Start Date End Date Taking? Authorizing Provider  ibuprofen (ADVIL,MOTRIN) 800 MG tablet Take 1 tablet (800 mg total) by mouth every 8 (eight) hours as needed for headache. 06/11/17 07/11/17 Yes Mortis, Sharyon Medicus, PA-C    Family History No family history on file.  Social History Social History   Tobacco Use  . Smoking status: Never  Smoker  . Smokeless tobacco: Never Used  Substance Use Topics  . Alcohol use: No  . Drug use: No     Allergies   Penicillins   Review of Systems Review of Systems  All other systems reviewed and are negative.    Physical Exam Updated Vital Signs BP 103/83   Pulse (!) 103   Temp 99 F (37.2 C) (Oral)   Resp 18   Ht 5\' 2"  (1.575 m)   Wt 56.2 kg (124 lb)   LMP 06/13/2017 (Approximate)   SpO2 100%   BMI 22.68 kg/m   Physical Exam  Constitutional: She is oriented to person, place, and time. She appears well-developed and well-nourished.  HENT:  Head: Normocephalic and atraumatic.  Left tympanic membrane is congested and erythematous the right is incompletely visualized secondary to cerumen impaction  Eyes: Pupils are equal, round, and reactive to light. Conjunctivae and EOM are normal.  Neck: Normal range of motion. Neck supple.  Cardiovascular: Normal rate and regular rhythm. Exam reveals no gallop and no friction rub.  No murmur heard. Pulmonary/Chest: Effort normal and breath sounds normal. No respiratory distress. She has no wheezes. She has no  rales. She exhibits no tenderness.  Lung sounds are clear to auscultation  Abdominal: Soft. Bowel sounds are normal. She exhibits no distension and no mass. There is no tenderness. There is no rebound and no guarding.  Musculoskeletal: Normal range of motion. She exhibits no edema or tenderness.  Neurological: She is alert and oriented to person, place, and time.  Skin: Skin is warm and dry.  Psychiatric: She has a normal mood and affect. Her behavior is normal. Judgment and thought content normal.  Nursing note and vitals reviewed.    ED Treatments / Results  Labs (all labs ordered are listed, but only abnormal results are displayed) Labs Reviewed  COMPREHENSIVE METABOLIC PANEL - Abnormal; Notable for the following components:      Result Value   Potassium 3.3 (*)    Glucose, Bld 109 (*)    Calcium 8.5 (*)    Total  Protein 6.2 (*)    ALT 11 (*)    All other components within normal limits  CBC WITH DIFFERENTIAL/PLATELET - Abnormal; Notable for the following components:   RBC 3.80 (*)    Hemoglobin 11.1 (*)    HCT 34.2 (*)    Lymphs Abs 0.3 (*)    All other components within normal limits  URINALYSIS, ROUTINE W REFLEX MICROSCOPIC - Abnormal; Notable for the following components:   APPearance HAZY (*)    Hgb urine dipstick MODERATE (*)    Leukocytes, UA TRACE (*)    All other components within normal limits  I-STAT CG4 LACTIC ACID, ED - Abnormal; Notable for the following components:   Lactic Acid, Venous 1.93 (*)    All other components within normal limits  GROUP A STREP BY PCR  I-STAT BETA HCG BLOOD, ED ([facility], [facility], [facility] ONLY)  I-STAT CG4 LACTIC ACID, ED    EKG None  Radiology Dg Chest 2 View  Result Date: 06/20/2017 CLINICAL DATA:  Fever with chest wall pain and shortness-of-breath. EXAM: CHEST - 2 VIEW COMPARISON:  None. FINDINGS: The heart size and mediastinal contours are within normal limits. Both lungs are clear. The visualized skeletal structures are unremarkable. IMPRESSION: No active cardiopulmonary disease. Electronically Signed   By: Elberta Fortisaniel  Boyle M.D.   On: 06/20/2017 18:44    Procedures Procedures (including critical care time)  Medications Ordered in ED Medications  acetaminophen (TYLENOL) tablet 650 mg (0 mg Oral Hold 06/20/17 1751)  amoxicillin-clavulanate (AUGMENTIN) 875-125 MG per tablet 1 tablet (1 tablet Oral Given 06/20/17 2257)     Initial Impression / Assessment and Plan / ED Course  I have reviewed the triage vital signs and the nursing notes.  Pertinent labs & imaging results that were available during my care of the patient were reviewed by me and considered in my medical decision making (see chart for details).     Patient with sinus pressure and congestion x1 week.  Reports fevers at home to 104.  She reports increased ear pain as well as increased sinus  pressure.  Had CT of her orbits 1 week ago which was negative.  Given length of illness, will treat with Augmentin.  Lung sounds are clear.  She is not hypoxic.  Have a low suspicion for PE.  She is well-appearing, and in no acute distress.  Final Clinical Impressions(s) / ED Diagnoses   Final diagnoses:  Sinusitis, unspecified chronicity, unspecified location    ED Discharge Orders        Ordered    amoxicillin-clavulanate (AUGMENTIN) 875-125 MG tablet  Every  12 hours     06/20/17 2311    fluticasone (FLONASE) 50 MCG/ACT nasal spray  Daily     06/20/17 2311       Roxy Horseman, PA-C 06/20/17 2311    Charlynne Pander, MD 06/20/17 [phone]

## 2017-12-19 ENCOUNTER — Encounter (HOSPITAL_COMMUNITY): Payer: Self-pay | Admitting: Emergency Medicine

## 2017-12-19 ENCOUNTER — Emergency Department (HOSPITAL_COMMUNITY)
Admission: EM | Admit: 2017-12-19 | Discharge: 2017-12-19 | Disposition: A | Discharge status: Home / Self Care | Payer: 59 | Attending: Emergency Medicine | Admitting: Emergency Medicine

## 2017-12-19 DIAGNOSIS — R197 Diarrhea, unspecified: Secondary | ICD-10-CM | POA: Insufficient documentation

## 2017-12-19 DIAGNOSIS — R112 Nausea with vomiting, unspecified: Secondary | ICD-10-CM | POA: Diagnosis present

## 2017-12-19 DIAGNOSIS — J45909 Unspecified asthma, uncomplicated: Secondary | ICD-10-CM | POA: Diagnosis not present

## 2017-12-19 DIAGNOSIS — Z79899 Other long term (current) drug therapy: Secondary | ICD-10-CM | POA: Insufficient documentation

## 2017-12-19 DIAGNOSIS — R319 Hematuria, unspecified: Secondary | ICD-10-CM | POA: Diagnosis not present

## 2017-12-19 DIAGNOSIS — R1084 Generalized abdominal pain: Secondary | ICD-10-CM | POA: Insufficient documentation

## 2017-12-19 LAB — COMPREHENSIVE METABOLIC PANEL
ALBUMIN: 4 g/dL (ref 3.5–5.0)
ALT: 12 U/L (ref 0–44)
ANION GAP: 12 (ref 5–15)
AST: 16 U/L (ref 15–41)
Alkaline Phosphatase: 56 U/L (ref 38–126)
BUN: 10 mg/dL (ref 6–20)
CHLORIDE: 105 mmol/L (ref 98–111)
CO2: 24 mmol/L (ref 22–32)
Calcium: 9.2 mg/dL (ref 8.9–10.3)
Creatinine, Ser: 0.85 mg/dL (ref 0.44–1.00)
GFR calc Af Amer: >60 mL/min (ref >60)
GFR calc non Af Amer: >60 mL/min (ref >60)
GLUCOSE: 114 mg/dL — AB (ref 70–99)
POTASSIUM: 3.8 mmol/L (ref 3.5–5.1)
SODIUM: 141 mmol/L (ref 135–145)
TOTAL PROTEIN: 7.4 g/dL (ref 6.5–8.1)
Total Bilirubin: 0.5 mg/dL (ref 0.3–1.2)

## 2017-12-19 LAB — LIPASE, BLOOD: LIPASE: 40 U/L (ref 11–51)

## 2017-12-19 LAB — URINALYSIS, ROUTINE W REFLEX MICROSCOPIC
BILIRUBIN URINE: NEGATIVE
Bacteria, UA: NONE SEEN
Glucose, UA: NEGATIVE mg/dL
Ketones, ur: 5 mg/dL — AB
Nitrite: NEGATIVE
PH: 5 (ref 5.0–8.0)
Protein, ur: NEGATIVE mg/dL
Specific Gravity, Urine: 1.029 (ref 1.005–1.030)

## 2017-12-19 LAB — I-STAT BETA HCG BLOOD, ED (MC, WL, AP ONLY): I-stat hCG, quantitative: <5 m[IU]/mL (ref <5)

## 2017-12-19 LAB — CBC
HCT: 41.4 % (ref 36.0–46.0)
HEMOGLOBIN: 13.3 g/dL (ref 12.0–15.0)
MCH: 28.4 pg (ref 26.0–34.0)
MCHC: 32.1 g/dL (ref 30.0–36.0)
MCV: 88.5 fL (ref 80.0–100.0)
Platelets: 326 10*3/uL (ref 150–400)
RBC: 4.68 MIL/uL (ref 3.87–5.11)
RDW: 13.1 % (ref 11.5–15.5)
WBC: 6.9 10*3/uL (ref 4.0–10.5)
nRBC: 0 % (ref 0.0–0.2)

## 2017-12-19 MED ORDER — ONDANSETRON 4 MG PO TBDP
4.0000 mg | ORAL_TABLET | Freq: Once | ORAL | Status: AC | PRN
  Administered 2017-12-19: 4 mg via ORAL
  Filled 2017-12-19: qty 1

## 2017-12-19 MED ORDER — DICYCLOMINE HCL 10 MG PO CAPS
20.0000 mg | ORAL_CAPSULE | Freq: Once | ORAL | Status: AC
  Administered 2017-12-19: 20 mg via ORAL
  Filled 2017-12-19: qty 2

## 2017-12-19 MED ORDER — ONDANSETRON 4 MG PO TBDP: 4.0000 mg | ORAL_TABLET | Freq: Three times a day (TID) | ORAL | 0 refills | Status: DC | PRN

## 2017-12-19 MED ORDER — ALUM & MAG HYDROXIDE-SIMETH 200-200-20 MG/5ML PO SUSP
30.0000 mL | Freq: Once | ORAL | Status: DC
  Filled 2017-12-19: qty 30

## 2017-12-19 MED ORDER — DICYCLOMINE HCL 20 MG PO TABS: 20.0000 mg | ORAL_TABLET | Freq: Three times a day (TID) | ORAL | 0 refills | Status: DC | PRN

## 2017-12-19 MED ORDER — LIDOCAINE VISCOUS HCL 2 % MT SOLN
15.0000 mL | Freq: Once | OROMUCOSAL | Status: DC
  Filled 2017-12-19: qty 15

## 2017-12-19 NOTE — ED Notes (Signed)
Pt reports vomiting when she was in the restroom.

## 2017-12-19 NOTE — ED Triage Notes (Signed)
Pt arrives to ED after having a sudden onset of vomiting at 3 am with abd cramping. Pt states a lot of people at work have had the stomach virus this week.

## 2017-12-19 NOTE — ED Notes (Signed)
Pt states she is unable to provide urine sample at this time.

## 2017-12-19 NOTE — Discharge Instructions (Addendum)
You are seen in the ER today for nausea, vomiting, and diarrhea as well as abdominal pain.  Your labs were all reassuring.  There was some blood in your urine, have this rechecked by a primary care provider within the next 1 to 2 weeks.  We are sending you home with Zofran, antinausea medicine, takes every 8 hours as needed for nausea and vomiting, as well as Bentyl, and antispasmodic medicine, take this every 8 hours as needed for abdominal pain.   We have prescribed you new medication(s) today. Discuss the medications prescribed today with your pharmacist as they can have adverse effects and interactions with your other medicines including over the counter and prescribed medications. Seek medical evaluation if you start to experience new or abnormal symptoms after taking one of these medicines, seek care immediately if you start to experience difficulty breathing, feeling of your throat closing, facial swelling, or rash as these could be indications of a more serious allergic reaction  Please be sure to continue to hydrate.  Please follow-up with primary care within 3 to 5 days.  Return to ER for new or worsening symptoms including but not limited to inability to keep fluids down, blood in the vomit, blood in your stool, worsening pain, or any other concerns.

## 2017-12-19 NOTE — ED Provider Notes (Signed)
[facility] EMERGENCY DEPARTMENT Provider Note   CSN: [id number] Arrival date & time: 12/19/17  4098     History   Chief Complaint Chief Complaint  Patient presents with  . Emesis    HPI Elaine Stewart is a 22 y.o. female with a hx of JRA and asthma who presents to the ED with complaints of vomiting that started at 0300 this AM. Patient states she woke from sleep with nausea & vomiting. She has had a total of 7-8 episodes of emesis, no hematemesis. Reports w/ the vomiting she has developed generalized abdominal soreness that becomes more crampy in nature with vomiting episodes, otherwise no specific alleviating/aggravating factors. Reports 1 episode of diarrhea, non bloody as well. She states her son and her coworkers have all been sick with similar illnesses, she thinks she has the "24 hour bug." Denies fever, chills, chest pain, dyspnea, melena, hematochezia, hematemesis, or dysuria.    HPI  Past Medical History:  Diagnosis Date  . Acne 09/27/2010  . Arthritis   . Asthma affecting pregnancy, antepartum 05/07/2014  . BV (bacterial vaginosis) 01/13/2014  . GBS (group B streptococcus) UTI complicating pregnancy 01/13/2014    Patient Active Problem List   Diagnosis Date Noted  . Anemia 12/05/2015  . 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 Rhinitis, allergic 09/29/2010  . Hypertrophy of tonsils and adenoids 09/27/2010    Past Surgical History:  Procedure Laterality Date  . NO PAST SURGERIES       OB History    Gravida  2   Para      Term      Preterm      AB      Living        SAB      TAB      Ectopic      Multiple      Live Births               Home Medications    Prior to Admission medications   Medication Sig Start Date End Date Taking? Authorizing Provider  amoxicillin-clavulanate (AUGMENTIN) 875-125 MG tablet Take 1 tablet by mouth every 12 (twelve) hours. 06/20/17   Roxy Horseman, PA-C    fluticasone (FLONASE) 50 MCG/ACT nasal spray Place 1 spray into both nostrils daily. 06/20/17   Roxy Horseman, PA-C    Family History No family history on file.  Social History Social History   Tobacco Use  . Smoking status: Never Smoker  . Smokeless tobacco: Never Used  Substance Use Topics  . Alcohol use: No  . Drug use: No     Allergies   Penicillins   Review of Systems Review of Systems  Constitutional: Negative for chills and fever.  Respiratory: Negative for shortness of breath.   Cardiovascular: Negative for chest pain.  Gastrointestinal: Positive for abdominal pain, diarrhea, nausea and vomiting. Negative for blood in stool.  Genitourinary: Negative for dysuria.  Neurological: Negative for syncope.  All other systems reviewed and are negative.    Physical Exam Updated Vital Signs BP 104/80 (BP Location: Right Arm)   Pulse (!) 107   Temp 98 F (36.7 C) (Oral)   Resp 16   LMP 12/12/2017   SpO2 97%   Physical Exam Vitals signs and nursing note reviewed.  Constitutional:      General: She is not in acute distress.    Appearance: She is well-developed. She is not toxic-appearing.  HENT:     Head:  Normocephalic and atraumatic.  Eyes:     General:        Right eye: No discharge.        Left eye: No discharge.     Conjunctiva/sclera: Conjunctivae normal.  Neck:     Musculoskeletal: Neck supple.  Cardiovascular:     Rate and Rhythm: Normal rate and regular rhythm.  Pulmonary:     Effort: Pulmonary effort is normal. No respiratory distress.     Breath sounds: Normal breath sounds. No wheezing, rhonchi or rales.  Abdominal:     General: There is no distension.     Palpations: Abdomen is soft.     Tenderness: There is abdominal tenderness (mild, generalized, non focal). There is no right CVA tenderness, left CVA tenderness, guarding or rebound.  Skin:    General: Skin is warm and dry.     Findings: No rash.  Neurological:     Mental Status: She  is alert.     Comments: Clear speech.   Psychiatric:        Behavior: Behavior normal.      ED Treatments / Results  Labs (all labs ordered are listed, but only abnormal results are displayed) Labs Reviewed  COMPREHENSIVE METABOLIC PANEL - Abnormal; Notable for the following components:      Result Value   Glucose, Bld 114 (*)    All other components within normal limits  URINALYSIS, ROUTINE W REFLEX MICROSCOPIC - Abnormal; Notable for the following components:   APPearance HAZY (*)    Hgb urine dipstick MODERATE (*)    Ketones, ur 5 (*)    Leukocytes, UA SMALL (*)    All other components within normal limits  LIPASE, BLOOD  CBC  I-STAT BETA HCG BLOOD, ED ([facility], [facility], [facility] ONLY)    EKG None  Radiology No results found.  Procedures Procedures (including critical care time)  Medications Ordered in ED Medications  alum & mag hydroxide-simeth (MAALOX/MYLANTA) 200-200-20 MG/5ML suspension 30 mL (has no administration in time range)    And  lidocaine (XYLOCAINE) 2 % viscous mouth solution 15 mL (has no administration in time range)  dicyclomine (BENTYL) capsule 20 mg (has no administration in time range)  ondansetron (ZOFRAN-ODT) disintegrating tablet 4 mg (4 mg Oral Given 12/19/17 0746)     Initial Impression / Assessment and Plan / ED Course  I have reviewed the triage vital signs and the nursing notes.  Pertinent labs & imaging results that were available during my care of the patient were reviewed by me and considered in my medical decision making (see chart for details).    Patient presents to the ED with complaints of nausea/vomiting w/ resultant abdominal discomfort. Patient nontoxic appearing, in no apparent distress, initial tachycardia normalized on my exam and with repeat vitals. On exam patient has mild non focal tenderness throughout, no peritoneal signs. Will evaluate with labs. Anti-emetics per triage. Patient would prefer to avoid IV if possible, will trial PO  meds for symptomatic improvement.   Labs reviewed and grossly unremarkable. No leukocytosis, no anemia, no significant electrolyte derangements. LFTs, renal function, and lipase WNL. Urinalysis without obvious infection, no urinary sxs- doubt UTI, there is hematuria- this will require PCP recheck.    On repeat abdominal exam patient remains without peritoneal signs, doubt cholecystitis, pancreatitis, diverticulitis, appendicitis, bowel obstruction/perforation, PID, or ectopic pregnancy. Patient tolerating PO in the emergency department initially then had episode of vomiting just prior to discharge- we discussed staying for additional anti-emetics as well as  risks/benefits and she ultimately  Refused and would like to go home. Suspect viral GI illness given presentation & recent sick contacts, will send home with zofran & bentyl w/ strict return precautions.  discussed results, treatment plan, need for PCP follow-up, and return precautions with the patient. Provided opportunity for questions, patient confirmed understanding and is in agreement with plan.   Vitals:   12/19/17 0722 12/19/17 1018  BP: 104/80 115/75  Pulse: (!) 107 85  Resp: 16 16  Temp: 98 F (36.7 C)   SpO2: 97% 96%    Final Clinical Impressions(s) / ED Diagnoses   Final diagnoses:  Nausea vomiting and diarrhea  Generalized abdominal pain  Hematuria, unspecified type    ED Discharge Orders         Ordered    ondansetron (ZOFRAN ODT) 4 MG disintegrating tablet  Every 8 hours PRN     12/19/17 1049    dicyclomine (BENTYL) 20 MG tablet  Every 8 hours PRN     12/19/17 1049           Xinyi Batton, RosenhaynSamantha R, PA-C 12/19/17 1052    Sabas SousBero, Michael M, MD 12/19/17 1710

## 2018-02-15 ENCOUNTER — Ambulatory Visit (HOSPITAL_COMMUNITY)
Admission: EM | Admit: 2018-02-15 | Discharge: 2018-02-15 | Disposition: A | Discharge status: Home / Self Care | Payer: 59 | Attending: Family Medicine | Admitting: Family Medicine

## 2018-02-15 ENCOUNTER — Other Ambulatory Visit: Payer: Self-pay

## 2018-02-15 ENCOUNTER — Encounter (HOSPITAL_COMMUNITY): Payer: Self-pay | Admitting: Family Medicine

## 2018-02-15 DIAGNOSIS — Z113 Encounter for screening for infections with a predominantly sexual mode of transmission: Secondary | ICD-10-CM | POA: Insufficient documentation

## 2018-02-15 DIAGNOSIS — Z3202 Encounter for pregnancy test, result negative: Secondary | ICD-10-CM

## 2018-02-15 LAB — POCT URINALYSIS DIP (DEVICE)
BILIRUBIN URINE: NEGATIVE
Glucose, UA: NEGATIVE mg/dL
KETONES UR: NEGATIVE mg/dL
NITRITE: NEGATIVE
Protein, ur: NEGATIVE mg/dL
Specific Gravity, Urine: 1.015 (ref 1.005–1.030)
Urobilinogen, UA: 0.2 mg/dL (ref 0.0–1.0)
pH: 7 (ref 5.0–8.0)

## 2018-02-15 LAB — POCT PREGNANCY, URINE: Preg Test, Ur: NEGATIVE

## 2018-02-15 MED ORDER — LIDOCAINE HCL (PF) 1 % IJ SOLN
INTRAMUSCULAR | Status: AC
  Filled 2018-02-15: qty 2

## 2018-02-15 MED ORDER — AZITHROMYCIN 250 MG PO TABS
1000.0000 mg | ORAL_TABLET | Freq: Once | ORAL | Status: AC
  Administered 2018-02-15: 1000 mg via ORAL

## 2018-02-15 MED ORDER — AZITHROMYCIN 250 MG PO TABS
ORAL_TABLET | ORAL | Status: AC
  Filled 2018-02-15: qty 4

## 2018-02-15 MED ORDER — CEFTRIAXONE SODIUM 250 MG IJ SOLR: 250.0000 mg | Freq: Once | INTRAMUSCULAR | Status: DC

## 2018-02-15 MED ORDER — CEFTRIAXONE SODIUM 250 MG IJ SOLR
INTRAMUSCULAR | Status: AC
  Filled 2018-02-15: qty 250

## 2018-02-15 NOTE — ED Provider Notes (Signed)
[facility]    CSN: [id number] Arrival date & time: 02/15/18  1307     History   Chief Complaint Chief Complaint  Patient presents with  . SEXUALLY TRANSMITTED DISEASE  . Appointment    1:15 pm    HPI Elaine Stewart is a 23 y.o. female.   Patient is a 23 year old female who presents today for STD screening.  She is denying any current symptoms.  She is currently sexually active with one partner.  Reports condom use.  She is not currently on any birth control.  Last menstrual period was 1 week ago.       Past Medical History:  Diagnosis Date  . Acne 09/27/2010  . Arthritis   . Asthma affecting pregnancy, antepartum 05/07/2014  . BV (bacterial vaginosis) 01/13/2014  . GBS (group B streptococcus) UTI complicating pregnancy 01/13/2014    Patient Active Problem List   Diagnosis Date Noted  . Anemia 12/05/2015  . 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 Rhinitis, allergic 09/29/2010  . Hypertrophy of tonsils and adenoids 09/27/2010    Past Surgical History:  Procedure Laterality Date  . NO PAST SURGERIES      OB History    Gravida  2   Para      Term      Preterm      AB      Living        SAB      TAB      Ectopic      Multiple      Live Births               Home Medications    Prior to Admission medications   Medication Sig Start Date End Date Taking? Authorizing Provider  dicyclomine (BENTYL) 20 MG tablet Take 1 tablet (20 mg total) by mouth every 8 (eight) hours as needed for spasms. 12/19/17   Petrucelli, Samantha R, PA-C  fluticasone (FLONASE) 50 MCG/ACT nasal spray Place 1 spray into both nostrils daily. 06/20/17   Roxy HorsemanBrowning, Robert, PA-C  ondansetron (ZOFRAN ODT) 4 MG disintegrating tablet Take 1 tablet (4 mg total) by mouth every 8 (eight) hours as needed for nausea or vomiting. 12/19/17   Petrucelli, Pleas KochSamantha R, PA-C    Family History History reviewed. No pertinent family history.  Social  History Social History   Tobacco Use  . Smoking status: Never Smoker  . Smokeless tobacco: Never Used  Substance Use Topics  . Alcohol use: No  . Drug use: No     Allergies   Penicillins   Review of Systems Review of Systems  Genitourinary: Negative for decreased urine volume, difficulty urinating, dyspareunia, dysuria, enuresis, flank pain, frequency, genital sores, hematuria, menstrual problem, pelvic pain, urgency, vaginal bleeding, vaginal discharge and vaginal pain.     Physical Exam Triage Vital Signs ED Triage Vitals  Enc Vitals Group     BP 02/15/18 1330 (!) 117/56     Pulse Rate 02/15/18 1330 72     Resp --      Temp 02/15/18 1330 98.5 F (36.9 C)     Temp Source 02/15/18 1330 Oral     SpO2 --      Weight --      Height --      Head Circumference --      Peak Flow --      Pain Score 02/15/18 1349 0     Pain Loc --  Pain Edu? --      Excl. in GC? --    No data found.  Updated Vital Signs BP (!) 117/56 (BP Location: Left Arm)   Pulse 72   Temp 98.5 F (36.9 C) (Oral)   Visual Acuity Right Eye Distance:   Left Eye Distance:   Bilateral Distance:    Right Eye Near:   Left Eye Near:    Bilateral Near:     Physical Exam Vitals signs and nursing note reviewed.  Constitutional:      General: She is not in acute distress.    Appearance: Normal appearance. She is well-developed.  HENT:     Head: Normocephalic and atraumatic.  Eyes:     Conjunctiva/sclera: Conjunctivae normal.  Neck:     Musculoskeletal: Normal range of motion.  Pulmonary:     Effort: Pulmonary effort is normal.  Abdominal:     Palpations: Abdomen is soft.     Tenderness: There is no abdominal tenderness.  Musculoskeletal: Normal range of motion.  Skin:    General: Skin is warm and dry.  Neurological:     Mental Status: She is alert.      UC Treatments / Results  Labs (all labs ordered are listed, but only abnormal results are displayed) Labs Reviewed  POCT  URINALYSIS DIP (DEVICE) - Abnormal; Notable for the following components:      Result Value   Hgb urine dipstick TRACE (*)    Leukocytes,Ua TRACE (*)    All other components within normal limits  URINE CULTURE  POC URINE PREG, ED  POCT PREGNANCY, URINE  CERVICOVAGINAL ANCILLARY ONLY    EKG None  Radiology No results found.  Procedures Procedures (including critical care time)  Medications Ordered in UC Medications  cefTRIAXone (ROCEPHIN) injection 250 mg (250 mg Intramuscular Not Given 02/15/18 1417)  azithromycin (ZITHROMAX) tablet 1,000 mg (1,000 mg Oral Given 02/15/18 1414)    Initial Impression / Assessment and Plan / UC Course  I have reviewed the triage vital signs and the nursing notes.  Pertinent labs & imaging results that were available during my care of the patient were reviewed by me and considered in my medical decision making (see chart for details).     Standing swab for STD testing Lab results pending Treated prophylactically for gonorrhea and chlamydia Urine showed trace leuks and blood we will send this for culture Pregnancy test negative  Final Clinical Impressions(s) / UC Diagnoses   Final diagnoses:  None     Discharge Instructions     We are sending the swab for STD testing.  We will call you with any positive results. We will go ahead and treat you prophylactically today with medication for gonorrhea and chlamydia. Your urine showed trace blood and bacteria we will send this for culture Pregnancy test was negative Follow up as needed for continued or worsening symptoms     ED Prescriptions    None     Controlled Substance Prescriptions [place] Controlled Substance Registry consulted? Not Applicable   Janace Aris, NP 02/15/18 1506

## 2018-02-15 NOTE — ED Notes (Signed)
Refused ceftriaxone injection-wants results first.  Accepted the azithromycin medication.

## 2018-02-15 NOTE — ED Triage Notes (Signed)
Requesting an std check up, denies symptoms

## 2018-02-15 NOTE — Discharge Instructions (Addendum)
We are sending the swab for STD testing.  We will call you with any positive results. We will go ahead and treat you prophylactically today with medication for gonorrhea and chlamydia. Your urine showed trace blood and bacteria we will send this for culture Pregnancy test was negative Follow up as needed for continued or worsening symptoms

## 2018-02-16 LAB — CERVICOVAGINAL ANCILLARY ONLY
Bacterial vaginitis: NEGATIVE
CANDIDA VAGINITIS: NEGATIVE
CHLAMYDIA, DNA PROBE: POSITIVE — AB
Neisseria Gonorrhea: NEGATIVE
Trichomonas: NEGATIVE

## 2018-02-16 LAB — URINE CULTURE

## 2018-02-19 ENCOUNTER — Telehealth (HOSPITAL_COMMUNITY): Payer: Self-pay | Admitting: Emergency Medicine

## 2018-02-19 NOTE — Telephone Encounter (Signed)
Chlamydia is positive.  This was treated at the urgent care visit with po zithromax 1g.  Pt needs education to please refrain from sexual intercourse for 7 days to give the medicine time to work.  Sexual partners need to be notified and tested/treated.  Condoms may reduce risk of reinfection.  Recheck or followup with PCP for further evaluation if symptoms are not improving.  [facility] notified.  Patient contacted and made aware of all results, all questions answered.   

## 2018-02-26 ENCOUNTER — Ambulatory Visit (HOSPITAL_COMMUNITY)
Admission: EM | Admit: 2018-02-26 | Discharge: 2018-02-26 | Disposition: A | Discharge status: Home / Self Care | Payer: 59 | Attending: Family Medicine | Admitting: Family Medicine

## 2018-02-26 ENCOUNTER — Encounter (HOSPITAL_COMMUNITY): Payer: Self-pay | Admitting: Emergency Medicine

## 2018-02-26 DIAGNOSIS — Z113 Encounter for screening for infections with a predominantly sexual mode of transmission: Secondary | ICD-10-CM | POA: Insufficient documentation

## 2018-02-26 DIAGNOSIS — Z8619 Personal history of other infectious and parasitic diseases: Secondary | ICD-10-CM | POA: Diagnosis not present

## 2018-02-26 MED ORDER — AZITHROMYCIN 250 MG PO TABS
ORAL_TABLET | ORAL | Status: AC
  Filled 2018-02-26: qty 4

## 2018-02-26 MED ORDER — AZITHROMYCIN 250 MG PO TABS
1000.0000 mg | ORAL_TABLET | Freq: Once | ORAL | Status: AC
  Administered 2018-02-26: 1000 mg via ORAL

## 2018-02-26 NOTE — Discharge Instructions (Signed)
We area testing you again for STDs.  Another dose of azithromycin given here today since you were unsure if you vomited the other dose up

## 2018-02-26 NOTE — ED Triage Notes (Signed)
Pt states she was seen here on 2/13 for chlamydia and wants to be sure she is "cleared up" no symptoms.

## 2018-02-27 NOTE — ED Provider Notes (Signed)
[facility]    CSN: [id number] Arrival date & time: 02/26/18  1115     History   Chief Complaint Chief Complaint  Patient presents with  . SEXUALLY TRANSMITTED DISEASE    HPI Elaine Stewart is a 23 y.o. female.   Patient is a 23 year old female presents for recheck of STDs.  She was seen here on 02/15/2018 and treated for chlamydia.  Her testing did show chlamydia to be positive.  She reports that she vomited up the medication.  She is worried that the medication did not work.  She denies any current symptoms.     Past Medical History:  Diagnosis Date  . Acne 09/27/2010  . Arthritis   . Asthma affecting pregnancy, antepartum 05/07/2014  . BV (bacterial vaginosis) 01/13/2014  . GBS (group B streptococcus) UTI complicating pregnancy 01/13/2014    Patient Active Problem List   Diagnosis Date Noted  . Anemia 12/05/2015  . 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 Rhinitis, allergic 09/29/2010  . Hypertrophy of tonsils and adenoids 09/27/2010    Past Surgical History:  Procedure Laterality Date  . NO PAST SURGERIES      OB History    Gravida  2   Para      Term      Preterm      AB      Living        SAB      TAB      Ectopic      Multiple      Live Births               Home Medications    Prior to Admission medications   Medication Sig Start Date End Date Taking? Authorizing Provider  dicyclomine (BENTYL) 20 MG tablet Take 1 tablet (20 mg total) by mouth every 8 (eight) hours as needed for spasms. 12/19/17   Petrucelli, Samantha R, PA-C  fluticasone (FLONASE) 50 MCG/ACT nasal spray Place 1 spray into both nostrils daily. 06/20/17   Roxy Horseman, PA-C  ondansetron (ZOFRAN ODT) 4 MG disintegrating tablet Take 1 tablet (4 mg total) by mouth every 8 (eight) hours as needed for nausea or vomiting. 12/19/17   Petrucelli, Pleas Koch, PA-C    Family History No family history on file.  Social  History Social History   Tobacco Use  . Smoking status: Never Smoker  . Smokeless tobacco: Never Used  Substance Use Topics  . Alcohol use: No  . Drug use: No     Allergies   Penicillins   Review of Systems Review of Systems  Genitourinary: Negative for decreased urine volume, difficulty urinating, dyspareunia, dysuria, enuresis, flank pain, frequency, genital sores, hematuria, menstrual problem, pelvic pain, urgency, vaginal bleeding, vaginal discharge and vaginal pain.     Physical Exam Triage Vital Signs ED Triage Vitals [02/26/18 1130]  Enc Vitals Group     BP      Pulse      Resp      Temp      Temp src      SpO2      Weight      Height      Head Circumference      Peak Flow      Pain Score 0     Pain Loc      Pain Edu?      Excl. in GC?    No data found.  Updated Vital Signs There were no  vitals taken for this visit.  Visual Acuity Right Eye Distance:   Left Eye Distance:   Bilateral Distance:    Right Eye Near:   Left Eye Near:    Bilateral Near:     Physical Exam Vitals signs and nursing note reviewed.  Constitutional:      General: She is not in acute distress.    Appearance: Normal appearance. She is not ill-appearing, toxic-appearing or diaphoretic.  HENT:     Head: Normocephalic and atraumatic.     Nose: Nose normal.  Eyes:     Conjunctiva/sclera: Conjunctivae normal.  Neck:     Musculoskeletal: Normal range of motion.  Pulmonary:     Effort: Pulmonary effort is normal.  Musculoskeletal: Normal range of motion.  Skin:    General: Skin is warm and dry.  Neurological:     Mental Status: She is alert.  Psychiatric:        Mood and Affect: Mood normal.      UC Treatments / Results  Labs (all labs ordered are listed, but only abnormal results are displayed) Labs Reviewed  CERVICOVAGINAL ANCILLARY ONLY    EKG None  Radiology No results found.  Procedures Procedures (including critical care time)  Medications Ordered  in UC Medications  azithromycin (ZITHROMAX) tablet 1,000 mg (1,000 mg Oral Given 02/26/18 1214)    Initial Impression / Assessment and Plan / UC Course  I have reviewed the triage vital signs and the nursing notes.  Pertinent labs & imaging results that were available during my care of the patient were reviewed by me and considered in my medical decision making (see chart for details).     Self swab sent for retesting. We will go ahead and treat patient again for chlamydia based on the fact that she vomited the medication Lab results pending  Final Clinical Impressions(s) / UC Diagnoses   Final diagnoses:  Screening examination for STD (sexually transmitted disease)     Discharge Instructions     We area testing you again for STDs.  Another dose of azithromycin given here today since you were unsure if you vomited the other dose up     ED Prescriptions    None     Controlled Substance Prescriptions [place] Controlled Substance Registry consulted? Not Applicable   Janace Aris, NP 02/27/18 [phone]

## 2018-02-28 LAB — CERVICOVAGINAL ANCILLARY ONLY
Bacterial vaginitis: POSITIVE — AB
Candida vaginitis: NEGATIVE
Chlamydia: POSITIVE — AB
Neisseria Gonorrhea: NEGATIVE
Trichomonas: NEGATIVE

## 2018-03-01 ENCOUNTER — Telehealth (HOSPITAL_COMMUNITY): Payer: Self-pay | Admitting: Emergency Medicine

## 2018-03-01 MED ORDER — METRONIDAZOLE 500 MG PO TABS: 500.0000 mg | ORAL_TABLET | Freq: Two times a day (BID) | ORAL | 0 refills | Status: AC

## 2018-03-01 NOTE — Telephone Encounter (Signed)
Chlamydia is positive.  This was treated at the urgent care visit with po zithromax 1g.  Pt needs education to please refrain from sexual intercourse for 7 days to give the medicine time to work.  Sexual partners need to be notified and tested/treated.  Condoms may reduce risk of reinfection.  Recheck or followup with PCP for further evaluation if symptoms are not improving.  [facility] notified.  Bacterial vaginosis is positive. This was not treated at the urgent care visit.  Flagyl 500 mg BID x 7 days #14 no refills sent to patients pharmacy of choice.    Attempted to reach patient. No answer at this time. Voicemail left.     

## 2018-03-02 ENCOUNTER — Telehealth (HOSPITAL_COMMUNITY): Payer: Self-pay | Admitting: *Deleted

## 2018-03-03 ENCOUNTER — Telehealth (HOSPITAL_COMMUNITY): Payer: Self-pay | Admitting: Emergency Medicine

## 2018-03-03 NOTE — Telephone Encounter (Signed)
Patient contacted and made aware of all results, all questions answered.   

## 2018-04-17 ENCOUNTER — Telehealth: Payer: 59 | Admitting: Physician Assistant

## 2018-04-17 DIAGNOSIS — N760 Acute vaginitis: Secondary | ICD-10-CM

## 2018-04-17 MED ORDER — FLUCONAZOLE 150 MG PO TABS: 150.0000 mg | ORAL_TABLET | Freq: Once | ORAL | 0 refills | Status: AC

## 2018-04-17 NOTE — Progress Notes (Signed)
We are sorry that you are not feeling well. Here is how we plan to help! Based on what you shared with me it looks like you: May have a yeast vaginosis   Vaginosis is an inflammation of the vagina that can result in discharge, itching and pain. The cause is usually a change in the normal balance of vaginal bacteria or an infection. Vaginosis can also result from reduced estrogen levels after menopause.  The most common causes of vaginosis are:   Bacterial vaginosis which results from an overgrowth of one on several organisms that are normally present in your vagina.   Yeast infections which are caused by a naturally occurring fungus called candida.   Vaginal atrophy (atrophic vaginosis) which results from the thinning of the vagina from reduced estrogen levels after menopause.   Trichomoniasis which is caused by a parasite and is commonly transmitted by sexual intercourse.  Factors that increase your risk of developing vaginosis include: Medications, such as antibiotics and steroids Uncontrolled diabetes Use of hygiene products such as bubble bath, vaginal spray or vaginal deodorant Douching Wearing damp or tight-fitting clothing Using an intrauterine device (IUD) for birth control Hormonal changes, such as those associated with pregnancy, birth control pills or menopause Sexual activity Having a sexually transmitted infection  Your treatment plan is A single Diflucan (fluconazole) 150mg  tablet once.  I have electronically sent this prescription into the pharmacy that you have chosen. If you continue to have pain with urination into tomorrow you will need a face to face evaluation.   Be sure to take all of the medication as directed. Stop taking any medication if you develop a rash, tongue swelling or shortness of breath. Mothers who are breast feeding should consider pumping and discarding their breast milk while on these antibiotics. However, there is no consensus that infant exposure  at these doses would be harmful.  Remember that medication creams can weaken latex condoms. Marland Kitchen.   HOME CARE:  Good hygiene may prevent some types of vaginosis from recurring and may relieve some symptoms:  Avoid baths, hot tubs and whirlpool spas. Rinse soap from your outer genital area after a shower, and dry the area well to prevent irritation. Don't use scented or harsh soaps, such as those with deodorant or antibacterial action. Avoid irritants. These include scented tampons and pads. Wipe from front to back after using the toilet. Doing so avoids spreading fecal bacteria to your vagina.  Other things that may help prevent vaginosis include:  Don't douche. Your vagina doesn't require cleansing other than normal bathing. Repetitive douching disrupts the normal organisms that reside in the vagina and can actually increase your risk of vaginal infection. Douching won't clear up a vaginal infection. Use a latex condom. Both female and female latex condoms may help you avoid infections spread by sexual contact. Wear cotton underwear. Also wear pantyhose with a cotton crotch. If you feel comfortable without it, skip wearing underwear to bed. Yeast thrives in Hilton Hot[employer]moist environments Your symptoms should improve in the next day or two.  GET HELP RIGHT AWAY IF:  You have pain in your lower abdomen ( pelvic area or over your ovaries) You develop nausea or vomiting You develop a fever Your discharge changes or worsens You have persistent pain with intercourse You develop shortness of breath, a rapid pulse, or you faint.  These symptoms could be signs of problems or infections that need to be evaluated by a medical provider now.  MAKE SURE YOU   Understand  these instructions. Will watch your condition. Will get help right away if you are not doing well or get worse.  Your e-visit answers were reviewed by a board certified advanced clinical practitioner to complete your personal care plan.  Depending upon the condition, your plan could have included both over the counter or prescription medications. Please review your pharmacy choice to make sure that you have choses a pharmacy that is open for you to pick up any needed prescription, Your safety is important to [place]. If you have drug allergies check your prescription carefully.   You can use MyChart to ask questions about today's visit, request a non-urgent call back, or ask for a work or school excuse for 24 hours related to this e-Visit. If it has been greater than 24 hours you will need to follow up with your provider, or enter a new e-Visit to address those concerns. You will get a MyChart message within the next two days asking about your experience. I hope that your e-visit has been valuable and will speed your recovery.  ===View-only below this line===   ----- Message -----    From: [name]    Sent: 04/17/2018  1:42 PM EDT      To: E-Visit Mailing List Subject: E-Visit Submission: Vaginal Symptoms  E-Visit Submission: Vaginal Symptoms --------------------------------  Question: Which of the following are you experiencing? Answer:   Vaginal itching            Vaginal discharge  Question: Are you having pain while passing urine? Answer:   Yes, I have pain while urinating  Question: Which of the following applies to your vaginal discharge Answer:   I have a white/milky discharge  Question: Are you pregnant? Answer:   I am confident that I am not pregnant  Question: Are you breastfeeding? Answer:   No  Question: Which of the following are you experiencing? Answer:   None of the above  Question: Do you have any sores on your genitals? Answer:   No  Question: Have you taken antibiotics recently? Answer:   I have not been on any antibiotics  Question: Do you do any of the following? Answer:   None of the above  Question: Which of the following applies to your menstrual period? Answer:   I had a  menstrual period in the last 2 weeks  Question: Have you had similar symptoms in the past? Answer:   Yes, I have had similar symptoms before  Question: When you had similar symptoms in  the past, did any of the following work? Answer:   Pills for yeast infection            Pills for urine infection            Cranberry juice  Question: Do you have a fever? Answer:   No, I do not have a fever  Question: During the past 2 months, have you had sexual contact with a specific person for the first time? Answer:   No  Question: Has a person with whom you have had sexual contact been recently told they have a disease possibly acquired through sex? Answer:   No  Question: Please list your medication allergies that you may have ? (If 'none' , please list as 'none') Answer:   None  Question: Please list any additional comments  Answer:   I pretty confident that I have a yeast infection and my vaginal area is sore due to the rubbing from the itching  A total of 5-10 minutes was spent evaluating this patients questionnaire and formulating a plan of care.

## 2018-04-18 ENCOUNTER — Telehealth: Payer: 59 | Admitting: Nurse Practitioner

## 2018-04-18 DIAGNOSIS — N3000 Acute cystitis without hematuria: Secondary | ICD-10-CM

## 2018-04-18 MED ORDER — NITROFURANTOIN MONOHYD MACRO 100 MG PO CAPS: 100.0000 mg | ORAL_CAPSULE | Freq: Two times a day (BID) | ORAL | 0 refills | Status: DC

## 2018-04-18 NOTE — Progress Notes (Signed)

## 2018-04-22 ENCOUNTER — Telehealth: Payer: 59 | Admitting: Nurse Practitioner

## 2018-04-22 DIAGNOSIS — B9689 Other specified bacterial agents as the cause of diseases classified elsewhere: Secondary | ICD-10-CM

## 2018-04-22 DIAGNOSIS — N760 Acute vaginitis: Secondary | ICD-10-CM

## 2018-04-22 MED ORDER — METRONIDAZOLE 500 MG PO TABS: 500.0000 mg | ORAL_TABLET | Freq: Two times a day (BID) | ORAL | 0 refills | Status: DC

## 2018-04-22 NOTE — Progress Notes (Signed)

## 2018-06-06 ENCOUNTER — Ambulatory Visit (HOSPITAL_COMMUNITY)
Admission: EM | Admit: 2018-06-06 | Discharge: 2018-06-06 | Disposition: A | Discharge status: Home / Self Care | Payer: 59 | Attending: Family Medicine | Admitting: Family Medicine

## 2018-06-06 ENCOUNTER — Other Ambulatory Visit: Payer: Self-pay

## 2018-06-06 ENCOUNTER — Encounter (HOSPITAL_COMMUNITY): Payer: Self-pay

## 2018-06-06 DIAGNOSIS — N898 Other specified noninflammatory disorders of vagina: Secondary | ICD-10-CM

## 2018-06-06 DIAGNOSIS — Z3201 Encounter for pregnancy test, result positive: Secondary | ICD-10-CM | POA: Diagnosis not present

## 2018-06-06 LAB — POCT PREGNANCY, URINE: Preg Test, Ur: POSITIVE — AB

## 2018-06-06 MED ORDER — CLOTRIMAZOLE 1 % VA CREA: 1.0000 | TOPICAL_CREAM | Freq: Every day | VAGINAL | 0 refills | Status: AC

## 2018-06-06 MED ORDER — FLUCONAZOLE 150 MG PO TABS: 150.0000 mg | ORAL_TABLET | Freq: Once | ORAL | 0 refills | Status: DC

## 2018-06-06 NOTE — ED Provider Notes (Signed)
[facility]    CSN: [id number] Arrival date & time: 06/06/18  1606     History   Chief Complaint Chief Complaint  Patient presents with  . SEXUALLY TRANSMITTED DISEASE    HPI Elaine Stewart is a 23 y.o. female no contributing past medical history presenting today for evaluation of vaginal discharge.  Patient states that over the past few days she has had a thick yellowish-greenish discharge.  She denies associated odor, itching or irritation.  States that appearance is similar to when she has had yeast in the past but does not have the associated itching.  She has been sexually active recently and would also like STD testing.  Denies any known exposures.  Last menstrual cycle was early May, not on any form of birth control.  She denies any pelvic pain, abdominal pain.  Denies nausea or vomiting.  Denies any rashes or lesions.  No complications with previous pregnancy.  HPI  Past Medical History:  Diagnosis Date  . Acne 09/27/2010  . Arthritis   . Asthma affecting pregnancy, antepartum 05/07/2014  . BV (bacterial vaginosis) 01/13/2014  . GBS (group B streptococcus) UTI complicating pregnancy 01/13/2014    Patient Active Problem List   Diagnosis Date Noted  . Anemia 12/05/2015  . 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 Rhinitis, allergic 09/29/2010  . Hypertrophy of tonsils and adenoids 09/27/2010    Past Surgical History:  Procedure Laterality Date  . NO PAST SURGERIES      OB History    Gravida  2   Para      Term      Preterm      AB      Living        SAB      TAB      Ectopic      Multiple      Live Births               Home Medications    Prior to Admission medications   Medication Sig Start Date End Date Taking? Authorizing Provider  clotrimazole (GYNE-LOTRIMIN) 1 % vaginal cream Place 1 Applicatorful vaginally at bedtime for 7 days. 06/06/18 06/13/18  , Junius Creamer, PA-C    Family History  Family History  Problem Relation Age of Onset  . Healthy Mother   . Healthy Father     Social History Social History   Tobacco Use  . Smoking status: Never Smoker  . Smokeless tobacco: Never Used  Substance Use Topics  . Alcohol use: No  . Drug use: No     Allergies   Penicillins   Review of Systems Review of Systems  Constitutional: Negative for fever.  Respiratory: Negative for shortness of breath.   Cardiovascular: Negative for chest pain.  Gastrointestinal: Negative for abdominal pain, diarrhea, nausea and vomiting.  Genitourinary: Positive for vaginal discharge. Negative for dysuria, flank pain, genital sores, hematuria, menstrual problem, vaginal bleeding and vaginal pain.  Musculoskeletal: Negative for back pain.  Skin: Negative for rash.  Neurological: Negative for dizziness, light-headedness and headaches.     Physical Exam Triage Vital Signs ED Triage Vitals  Enc Vitals Group     BP 06/06/18 1633 115/77     Pulse Rate 06/06/18 1633 (!) 104     Resp 06/06/18 1633 18     Temp 06/06/18 1633 98.7 F (37.1 C)     Temp Source 06/06/18 1633 Oral     SpO2 06/06/18 1633 100 %  Weight --      Height --      Head Circumference --      Peak Flow --      Pain Score 06/06/18 1631 0     Pain Loc --      Pain Edu? --      Excl. in GC? --    No data found.  Updated Vital Signs BP 115/77 (BP Location: Left Arm)   Pulse (!) 104   Temp 98.7 F (37.1 C) (Oral)   Resp 18   SpO2 100%   Visual Acuity Right Eye Distance:   Left Eye Distance:   Bilateral Distance:    Right Eye Near:   Left Eye Near:    Bilateral Near:     Physical Exam Vitals signs and nursing note reviewed.  Constitutional:      Appearance: She is well-developed.     Comments: No acute distress  HENT:     Head: Normocephalic and atraumatic.     Nose: Nose normal.  Eyes:     Conjunctiva/sclera: Conjunctivae normal.  Neck:     Musculoskeletal: Neck supple.  Cardiovascular:      Rate and Rhythm: Normal rate.  Pulmonary:     Effort: Pulmonary effort is normal. No respiratory distress.  Abdominal:     General: There is no distension.     Palpations: Abdomen is soft.     Comments: Soft, nondistended, nontender to light and deep palpation throughout entire abdomen  Musculoskeletal: Normal range of motion.  Skin:    General: Skin is warm and dry.  Neurological:     Mental Status: She is alert and oriented to person, place, and time.      UC Treatments / Results  Labs (all labs ordered are listed, but only abnormal results are displayed) Labs Reviewed  POCT PREGNANCY, URINE - Abnormal; Notable for the following components:      Result Value   Preg Test, Ur POSITIVE (*)    All other components within normal limits  POC URINE PREG, ED  CERVICOVAGINAL ANCILLARY ONLY    EKG None  Radiology No results found.  Procedures Procedures (including critical care time)  Medications Ordered in UC Medications - No data to display  Initial Impression / Assessment and Plan / UC Course  I have reviewed the triage vital signs and the nursing notes.  Pertinent labs & imaging results that were available during my care of the patient were reviewed by me and considered in my medical decision making (see chart for details).     Pregnancy test positive.  Vaginal swab obtained to check for causes of discharge.  Will treat with for yeast today with clotrimazole vaginal cream daily at bedtime x1 week.  Will call patient with results and provide further treatment if needed.  Begin prenatal vitamin, discussed prenatal care.  Follow-up with OB/GYN.Discussed strict return precautions. Patient verbalized understanding and is agreeable with plan.  Final Clinical Impressions(s) / UC Diagnoses   Final diagnoses:  Vaginal discharge  Positive pregnancy test     Discharge Instructions     Your pregnancy test was positive Please begin prenatal vitamin, follow-up with OB/GYN  Please use clotrimazole cream vaginally at bedtime for 1 week  We are testing you for Gonorrhea, Chlamydia, Trichomonas, Yeast and Bacterial Vaginosis. We will call you if anything is positive and let you know if you require any further treatment. Please inform partners of any positive results.   Please return if symptoms not  improving with treatment, development of fever, nausea, vomiting, abdominal pain.    ED Prescriptions    Medication Sig Dispense Auth. Provider   fluconazole (DIFLUCAN) 150 MG tablet  (Status: Discontinued) Take 1 tablet (150 mg total) by mouth once for 1 dose. 2 tablet ,  C, PA-C   clotrimazole (GYNE-LOTRIMIN) 1 % vaginal cream Place 1 Applicatorful vaginally at bedtime for 7 days. 45 g , [place] C, PA-C     Controlled Substance Prescriptions [place] Controlled Substance Registry consulted? Not Applicable   Lew Dawes,  C, [place] 06/06/18 1724

## 2018-06-06 NOTE — Discharge Instructions (Addendum)
Your pregnancy test was positive Please begin prenatal vitamin, follow-up with OB/GYN Please use clotrimazole cream vaginally at bedtime for 1 week  We are testing you for Gonorrhea, Chlamydia, Trichomonas, Yeast and Bacterial Vaginosis. We will call you if anything is positive and let you know if you require any further treatment. Please inform partners of any positive results.   Please return if symptoms not improving with treatment, development of fever, nausea, vomiting, abdominal pain.

## 2018-06-06 NOTE — ED Triage Notes (Signed)
Patient presents to Urgent Care with complaints of vaginal discharge that is yellow/green in color since having sexual intercourse with her partner. Patient reports she would like STD screening and to check for a yeast infection.

## 2018-06-07 ENCOUNTER — Telehealth: Payer: Self-pay | Admitting: Obstetrics and Gynecology

## 2018-06-07 MED ORDER — PRENATAL PLUS 27-1 MG PO TABS: 1.0000 | ORAL_TABLET | Freq: Every day | ORAL | 0 refills | Status: DC

## 2018-06-07 NOTE — Addendum Note (Signed)
Addended by: Ed Blalock on: 06/07/2018 09:26 AM   Modules accepted: Orders

## 2018-06-07 NOTE — Telephone Encounter (Signed)
The patient would like prenatal vitamins sent to [facility] on [address].

## 2018-06-07 NOTE — Telephone Encounter (Signed)
Pt reports she has positive pregnancy test. She had her last period the last week of May, maybe 3-4, period was normal. DOB 02/11/2019. Pt already has prenatal visits scheduled.   Pt currently being treated for yeast and was how she found out she was pregnant.   Informed pt I have called in Vitamins to her Pharmacy.

## 2018-06-08 LAB — CERVICOVAGINAL ANCILLARY ONLY
Bacterial vaginitis: POSITIVE — AB
Candida vaginitis: POSITIVE — AB
Chlamydia: POSITIVE — AB
Neisseria Gonorrhea: NEGATIVE
Trichomonas: NEGATIVE

## 2018-06-09 ENCOUNTER — Telehealth: Payer: 59 | Admitting: Nurse Practitioner

## 2018-06-09 DIAGNOSIS — A749 Chlamydial infection, unspecified: Secondary | ICD-10-CM

## 2018-06-09 NOTE — Progress Notes (Signed)
Based on what you shared with me it looks like you have chlamydia,that should be evaluated in a face to face office visit. There could be complications with th eantibiotics that treat chlamydia when you are pregnant. I would suggest you calling a OB/GYN office Monday morning and see if you can be seen. I would be more comfortable with a specialist treating you since you are pregnant. There will be no harm to baby from chlamydia if you wait to be seen on Monday. However you do need to get this taken care of, and your partner will need to be treated as well.  NOTE: If you entered your credit card information for this eVisit, you will not be charged. You may see a "hold" on your card for the $30 but that hold will drop off and you will not have a charge processed.  If you are having a true medical emergency please call 911.  If you need an urgent face to face visit, [facility] has four urgent care centers for your convenience.  If you need care fast and have a high deductible or no insurance consider:

## 2018-06-10 ENCOUNTER — Ambulatory Visit (HOSPITAL_COMMUNITY)
Admission: EM | Admit: 2018-06-10 | Discharge: 2018-06-10 | Disposition: A | Discharge status: Home / Self Care | Payer: 59 | Attending: Family Medicine | Admitting: Family Medicine

## 2018-06-10 ENCOUNTER — Encounter (HOSPITAL_COMMUNITY): Payer: Self-pay | Admitting: Emergency Medicine

## 2018-06-10 ENCOUNTER — Other Ambulatory Visit: Payer: Self-pay

## 2018-06-10 DIAGNOSIS — Z203 Contact with and (suspected) exposure to rabies: Secondary | ICD-10-CM

## 2018-06-10 MED ORDER — AZITHROMYCIN 250 MG PO TABS: 1000.0000 mg | ORAL_TABLET | Freq: Once | ORAL | Status: DC

## 2018-06-10 MED ORDER — AZITHROMYCIN 250 MG PO TABS
ORAL_TABLET | ORAL | Status: AC
  Filled 2018-06-10: qty 4

## 2018-06-10 NOTE — ED Provider Notes (Addendum)
Patient here for chlamydia treatment She will follow-up with her OB/GYN tomorrow as planned This will be charged as nursing visit       Orvan July, NP 06/10/18 1042

## 2018-06-10 NOTE — ED Triage Notes (Signed)
Looked on my chart and she was positive for Chlamydia and positive for pregnancy. Wanting to see what medications will be ok with the pregnancy.

## 2018-06-11 ENCOUNTER — Telehealth (HOSPITAL_COMMUNITY): Payer: Self-pay | Admitting: Emergency Medicine

## 2018-06-11 MED ORDER — METRONIDAZOLE 0.75 % VA GEL: 1.0000 | Freq: Every day | VAGINAL | 0 refills | Status: AC

## 2018-06-11 NOTE — Telephone Encounter (Signed)
Chlamydia is positive.  This was treated at the urgent care visit with po zithromax 1g.  Pt needs education to please refrain from sexual intercourse for 7 days to give the medicine time to work.  Sexual partners need to be notified and tested/treated.  Condoms may reduce risk of reinfection.  Recheck or followup with PCP for further evaluation if symptoms are not improving.  [facility] notified.  Bacterial vaginosis is positive. This was not treated at the urgent care visit.  Candida (yeast) is positive.  Prescription for fluconazole was given at the urgent care visit.    Pt is pregnant, per Lanelle Bal, pt okay to take metrogel after finishing the yeast cream. Will send to pharmacy. Pt has followup with OB on Thursday, pt instructed to tell OB what she was tested and treated for this week for follow up. Pt agreeable to plan.

## 2018-06-19 ENCOUNTER — Other Ambulatory Visit: Payer: Self-pay

## 2018-06-19 ENCOUNTER — Encounter (HOSPITAL_COMMUNITY): Payer: Self-pay

## 2018-06-19 ENCOUNTER — Telehealth (HOSPITAL_COMMUNITY): Payer: Self-pay | Admitting: Emergency Medicine

## 2018-06-19 ENCOUNTER — Ambulatory Visit (HOSPITAL_COMMUNITY)
Admission: EM | Admit: 2018-06-19 | Discharge: 2018-06-19 | Disposition: A | Discharge status: Home / Self Care | Payer: 59 | Attending: Emergency Medicine | Admitting: Emergency Medicine

## 2018-06-19 DIAGNOSIS — O219 Vomiting of pregnancy, unspecified: Secondary | ICD-10-CM | POA: Diagnosis not present

## 2018-06-19 DIAGNOSIS — Z3A01 Less than 8 weeks gestation of pregnancy: Secondary | ICD-10-CM | POA: Insufficient documentation

## 2018-06-19 DIAGNOSIS — Z113 Encounter for screening for infections with a predominantly sexual mode of transmission: Secondary | ICD-10-CM | POA: Insufficient documentation

## 2018-06-19 DIAGNOSIS — R110 Nausea: Secondary | ICD-10-CM

## 2018-06-19 MED ORDER — DOXYLAMINE-PYRIDOXINE 10-10 MG PO TBEC: 10.0000 mg | DELAYED_RELEASE_TABLET | Freq: Four times a day (QID) | ORAL | 0 refills | Status: DC | PRN

## 2018-06-19 NOTE — Telephone Encounter (Signed)
Patient left message about prescribed medicine not covered by insurance.  Spoke to provider that treated patient.  Patient may take vitamin b 6 OTC.  Patient agreeable.  Patient is still complaining of vaginal irritation.  Patient may use monistat, but we will wait for results before starting any other medicine.    Game plan: Get OTC Vitamin B 6 Use Monistat OTC if needed Wait for lab results and treat as indicated Keep appt with OB/GYN that is coming up at the end of this month.

## 2018-06-19 NOTE — ED Triage Notes (Signed)
Patient presents to Urgent Care with complaints of requesting a re-check for chlamydia since being treated for it a few weeks ago. Patient reports she also would like medication for nausea, caused by her pregnancy.

## 2018-06-19 NOTE — Discharge Instructions (Addendum)
We will call you if you test positive for chlamydia. Continue care w/ OB  May try nausea medication as follows: Doxylamine 10 mg/pyridoxine 10 mg (Diclegis): Initial: Two tablets at bedtime on day 1 and 2; if symptoms persist, take 1 tablet in morning and 2 tablets at bedtime on day 3; if symptoms persist, may increase to 1 tablet in morning, 1 tablet mid-afternoon, and 2 tablets at bedtime on day 4 (maximum: doxylamine 40 mg/pyridoxine 40 mg (4 tablets) per day).

## 2018-06-19 NOTE — ED Provider Notes (Signed)
[facility]    CSN: [id number] Arrival date & time: 06/19/18  1427     History   Chief Complaint Chief Complaint  Patient presents with  . Appointment    2:30  . Nausea  . SEXUALLY TRANSMITTED DISEASE    HPI Elaine Stewart is a 23 y.o. female who is 7 weeks 1 day gestation, with history of recent chlamydial infection, presenting for chlamydia repeat testing.  Treated on 6/7 for chlamydia with 1 g azithromycin and tolerated this well.  Patient states she was told to follow-up in 1 week for repeat test due to her pregnancy.  Patient denies abdominal, pelvic, vaginal pain, vaginal discharge, anogenital lesions, dyspnea hernia, dysuria, burning with urination, urinary frequency/urgency.  Patient states that she has an OB appointment coming up "in the next week or 2 " and intends on keeping this.  Patient also notes nausea without vomiting for the last few weeks.  Has tried changing her diet, increasing water intake, but has not had any relief.  Is wondering what she can take for this.  Patient states she is able to keep down food, water; denies decreased appetite.  Patient denies heartburn, reflux, belching, abdominal distention, diarrhea, weakness.  Of note, patient was treated with MetroGel for BV at last appointment patient states she has been using this, and has had significant provement of symptoms.   Past Medical History:  Diagnosis Date  . Acne 09/27/2010  . Arthritis   . Asthma affecting pregnancy, antepartum 05/07/2014  . BV (bacterial vaginosis) 01/13/2014  . GBS (group B streptococcus) UTI complicating pregnancy 01/13/2014    Patient Active Problem List   Diagnosis Date Noted  . Anemia 12/05/2015  . 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 Rhinitis, allergic 09/29/2010  . Hypertrophy of tonsils and adenoids 09/27/2010    Past Surgical History:  Procedure Laterality Date  . NO PAST SURGERIES      OB History    Gravida  3   Para       Term      Preterm      AB      Living        SAB      TAB      Ectopic      Multiple      Live Births               Home Medications    Prior to Admission medications   Medication Sig Start Date End Date Taking? Authorizing Provider  prenatal vitamin w/FE, FA (PRENATAL 1 + 1) 27-1 MG TABS tablet Take 1 tablet by mouth daily at 12 noon. 06/07/18  Yes Dove, Myra C, MD  Doxylamine-Pyridoxine 10-10 MG TBEC Take 10 mg by mouth 4 (four) times daily as needed. 06/19/18   Hall-Potvin, [place], PA-C    Family History Family History  Problem Relation Age of Onset  . Healthy Mother   . Healthy Father     Social History Social History   Tobacco Use  . Smoking status: Never Smoker  . Smokeless tobacco: Never Used  Substance Use Topics  . Alcohol use: No  . Drug use: No     Allergies   Penicillins   Review of Systems As per HPI   Physical Exam Triage Vital Signs ED Triage Vitals  Enc Vitals Group     BP 06/19/18 1447 108/61     Pulse Rate 06/19/18 1447 90     Resp 06/19/18 1447  16     Temp 06/19/18 1447 98.3 F (36.8 C)     Temp Source 06/19/18 1447 Oral     SpO2 06/19/18 1447 100 %     Weight --      Height --      Head Circumference --      Peak Flow --      Pain Score 06/19/18 1445 3     Pain Loc --      Pain Edu? --      Excl. in GC? --    No data found.  Updated Vital Signs BP 108/61 (BP Location: Left Arm)   Pulse 90   Temp 98.3 F (36.8 C) (Oral)   Resp 16   LMP 12/12/2017   SpO2 100%   Visual Acuity Right Eye Distance:   Left Eye Distance:   Bilateral Distance:    Right Eye Near:   Left Eye Near:    Bilateral Near:     Physical Exam Constitutional:      General: She is not in acute distress. HENT:     Head: Normocephalic and atraumatic.     Nose: Nose normal.     Mouth/Throat:     Mouth: Mucous membranes are moist.     Pharynx: Oropharynx is clear.  Eyes:     General: No scleral icterus.    Pupils: Pupils  are equal, round, and reactive to light.  Neck:     Musculoskeletal: Neck supple. No muscular tenderness.  Cardiovascular:     Rate and Rhythm: Normal rate.  Pulmonary:     Effort: Pulmonary effort is normal.  Abdominal:     General: Abdomen is flat. Bowel sounds are normal. There is no distension.     Palpations: Abdomen is soft.     Tenderness: There is no abdominal tenderness. There is no right CVA tenderness, left CVA tenderness or guarding.  Genitourinary:    Comments: Patient declined, self swab performed. Lymphadenopathy:     Cervical: No cervical adenopathy.  Skin:    General: Skin is warm.     Capillary Refill: Capillary refill takes less than 2 seconds.     Coloration: Skin is not jaundiced or pale.  Neurological:     Mental Status: She is alert and oriented to person, place, and time.      UC Treatments / Results  Labs (all labs ordered are listed, but only abnormal results are displayed) Labs Reviewed  CERVICOVAGINAL ANCILLARY ONLY    EKG None  Radiology No results found.  Procedures Procedures (including critical care time)  Medications Ordered in UC Medications - No data to display  Initial Impression / Assessment and Plan / UC Course  I have reviewed the triage vital signs and the nursing notes.  Pertinent labs & imaging results that were available during my care of the patient were reviewed by me and considered in my medical decision making (see chart for details).     23 year old female who is 7 weeks, 1 day gestation presenting for chlamydia testing repeat.  Patient was treated on 6/4 with 1 g azithromycin which she tolerated well.  Patient also tested positive for BV: States symptoms have resolved with MetroGel.  Overall, patient feels she is doing well and appears well nourished and well hydrated in office today.  We will call with results of cervical vaginal swab.  Will try vitamin B6 in conjunction with doxylamine nausea.  Patient to keep OB  follow-up appointments. Final Clinical Impressions(s) /  UC Diagnoses   Final diagnoses:  Less than [redacted] weeks gestation of pregnancy  Nausea  Encounter for screening examination for sexually transmitted disease     Discharge Instructions     We will call you if you test positive for chlamydia. Continue care w/ OB  May try nausea medication as follows: Doxylamine 10 mg/pyridoxine 10 mg (Diclegis): Initial: Two tablets at bedtime on day 1 and 2; if symptoms persist, take 1 tablet in morning and 2 tablets at bedtime on day 3; if symptoms persist, may increase to 1 tablet in morning, 1 tablet mid-afternoon, and 2 tablets at bedtime on day 4 (maximum: doxylamine 40 mg/pyridoxine 40 mg (4 tablets) per day).    ED Prescriptions    Medication Sig Dispense Auth. Provider   Doxylamine-Pyridoxine 10-10 MG TBEC Take 10 mg by mouth 4 (four) times daily as needed. 60 tablet Hall-Potvin, [place], PA-C     Controlled Substance Prescriptions [place] Controlled Substance Registry consulted? Not Applicable   Quincy Sheehan, [place] 06/19/18 1806

## 2018-06-20 LAB — CERVICOVAGINAL ANCILLARY ONLY
Chlamydia: NEGATIVE
Neisseria Gonorrhea: NEGATIVE

## 2018-06-21 ENCOUNTER — Encounter (HOSPITAL_COMMUNITY): Payer: Self-pay | Admitting: Emergency Medicine

## 2018-06-21 ENCOUNTER — Other Ambulatory Visit: Payer: Self-pay

## 2018-06-21 ENCOUNTER — Ambulatory Visit (HOSPITAL_COMMUNITY)
Admission: EM | Admit: 2018-06-21 | Discharge: 2018-06-21 | Disposition: A | Discharge status: Home / Self Care | Payer: 59 | Attending: Family Medicine | Admitting: Family Medicine

## 2018-06-21 DIAGNOSIS — Z3A08 8 weeks gestation of pregnancy: Secondary | ICD-10-CM

## 2018-06-21 DIAGNOSIS — R112 Nausea with vomiting, unspecified: Secondary | ICD-10-CM | POA: Diagnosis not present

## 2018-06-21 MED ORDER — ONDANSETRON 4 MG PO TBDP: 4.0000 mg | ORAL_TABLET | Freq: Three times a day (TID) | ORAL | 0 refills | Status: DC | PRN

## 2018-06-21 NOTE — Discharge Instructions (Addendum)
One-half of the 25 mg Unisom sleep tablet over-the-counter tablet or two chewable 5 mg tablets can be used off-label as an antiemetic. In addition, pyridoxine 25 mg, also available over-the-counter, is taken three or four times per day;This is a reasonable, less expensive substitute for combination tablets.  Zofran as needed for nausea- dissolves under tongue

## 2018-06-21 NOTE — ED Triage Notes (Signed)
Pt is almost [redacted] weeks pregnant. Reports severe nausea for over a week. PT is also vomiting 2-3 times daily for the last few days. PT was prescribed a med at her last visit, but due to a problem at the pharmacy she was not able to fill it. PT was told over the phone to try B6. That is not working. Per PT, her doctor in [place] told her to return here for zofran.

## 2018-06-22 NOTE — ED Provider Notes (Signed)
[facility]    CSN: [id number] Arrival date & time: 06/21/18  1634      History   Chief Complaint Chief Complaint  Patient presents with  . Nausea  . Emesis    HPI Elaine Stewart is a 23 y.o. female approximately [redacted] weeks pregnant presenting today for evaluation of nausea and vomiting.  Patient states that she has had nausea over the past week which of more recently has progressed into vomiting.  She has had approximately 2-3 episodes of vomiting a day of recently.  She has followed up with an [place]N in [place] which has performed ultrasound and confirmed intrauterine pregnancy.  She has been using over-the-counter B6 without relief of symptoms.  She has been unable to tolerate oral intake.  She denies any abdominal pain or pelvic pain at this time.  She denies significant lightheadedness or dizziness at this time.  She has started a prenatal vitamin.  She has plans to follow-up with women's clinic.  HPI  Past Medical History:  Diagnosis Date  . Acne 09/27/2010  . Arthritis   . Asthma affecting pregnancy, antepartum 05/07/2014  . BV (bacterial vaginosis) 01/13/2014  . GBS (group B streptococcus) UTI complicating pregnancy 01/13/2014    Patient Active Problem List   Diagnosis Date Noted  . Anemia 12/05/2015  . 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 Rhinitis, allergic 09/29/2010  . Hypertrophy of tonsils and adenoids 09/27/2010    Past Surgical History:  Procedure Laterality Date  . NO PAST SURGERIES      OB History    Gravida  3   Para      Term      Preterm      AB      Living        SAB      TAB      Ectopic      Multiple      Live Births               Home Medications    Prior to Admission medications   Medication Sig Start Date End Date Taking? Authorizing Provider  prenatal vitamin w/FE, FA (PRENATAL 1 + 1) 27-1 MG TABS tablet Take 1 tablet by mouth daily at 12 noon. 06/07/18  Yes Dove, Myra C, MD   ondansetron (ZOFRAN ODT) 4 MG disintegrating tablet Take 1 tablet (4 mg total) by mouth every 8 (eight) hours as needed for nausea or vomiting. 06/21/18   Kristen Fromm, Junius CreamerHallie C, PA-C    Family History Family History  Problem Relation Age of Onset  . Healthy Mother   . Healthy Father     Social History Social History   Tobacco Use  . Smoking status: Never Smoker  . Smokeless tobacco: Never Used  Substance Use Topics  . Alcohol use: No  . Drug use: No     Allergies   Penicillins   Review of Systems Review of Systems  Constitutional: Negative for fever.  Respiratory: Negative for shortness of breath.   Cardiovascular: Negative for chest pain.  Gastrointestinal: Positive for nausea and vomiting. Negative for abdominal pain and diarrhea.  Genitourinary: Negative for dysuria, flank pain, genital sores, hematuria, menstrual problem, vaginal bleeding, vaginal discharge and vaginal pain.  Musculoskeletal: Negative for back pain.  Skin: Negative for rash.  Neurological: Negative for dizziness, light-headedness and headaches.     Physical Exam Triage Vital Signs ED Triage Vitals  Enc Vitals Group     BP 06/21/18  1714 103/64     Pulse Rate 06/21/18 1714 94     Resp 06/21/18 1714 16     Temp 06/21/18 1714 98.2 F (36.8 C)     Temp Source 06/21/18 1714 Oral     SpO2 06/21/18 1714 100 %     Weight --      Height --      Head Circumference --      Peak Flow --      Pain Score 06/21/18 1712 3     Pain Loc --      Pain Edu? --      Excl. in GC? --    No data found.  Updated Vital Signs BP 103/64   Pulse 94   Temp 98.2 F (36.8 C) (Oral)   Resp 16   LMP 05/01/2018   SpO2 100%   Visual Acuity Right Eye Distance:   Left Eye Distance:   Bilateral Distance:    Right Eye Near:   Left Eye Near:    Bilateral Near:     Physical Exam Vitals signs and nursing note reviewed.  Constitutional:      General: She is not in acute distress.    Appearance: She is  well-developed.  HENT:     Head: Normocephalic and atraumatic.  Eyes:     Conjunctiva/sclera: Conjunctivae normal.  Neck:     Musculoskeletal: Neck supple.  Cardiovascular:     Rate and Rhythm: Normal rate and regular rhythm.     Heart sounds: No murmur.  Pulmonary:     Effort: Pulmonary effort is normal. No respiratory distress.     Breath sounds: Normal breath sounds.  Abdominal:     Palpations: Abdomen is soft.     Tenderness: There is no abdominal tenderness.     Comments: Soft, nondistended, nontender light deep palpation throughout abdomen.  Skin:    General: Skin is warm and dry.  Neurological:     Mental Status: She is alert.      UC Treatments / Results  Labs (all labs ordered are listed, but only abnormal results are displayed) Labs Reviewed - No data to display  EKG None  Radiology No results found.  Procedures Procedures (including critical care time)  Medications Ordered in UC Medications - No data to display  Initial Impression / Assessment and Plan / UC Course  I have reviewed the triage vital signs and the nursing notes.  Pertinent labs & imaging results that were available during my care of the patient were reviewed by me and considered in my medical decision making (see chart for details).    Insurance not Astronom[place]overing Diclegis. Discussed combining B6 with Unisom over-the-counter, did provide Zofran as alternative if symptoms still persistent.  Push fluids, slowly transition diet.  Ad[facility] if symptoms persisting and still not tolerating oral intake despite Zofran and developing any signs of dehydration of lightheadedness or dizziness.  Also to follow-up for abdominal pain.Discussed strict return precautions. Patient verbalized understanding and is agreeable with plan.  Final Clinical Impressions(s) / UC Diagnoses   Final diagnoses:  Non-intractable vomiting with nausea, unspecified vomiting type  [redacted] weeks  gestation of pregnancy     Discharge Instructions     One-half of the 25 mg Unisom sleep tablet over-the-counter tablet or two chewable 5 mg tablets can be used off-label as an antiemetic. In addition, pyridoxine 25 mg, also available over-the-counter, is taken three or four times per day;This is a reasonable,  less expensive substitute for combination tablets.  Zofran as needed for nausea- dissolves under tongue   ED Prescriptions    Medication Sig Dispense Auth. Provider   ondansetron (ZOFRAN ODT) 4 MG disintegrating tablet Take 1 tablet (4 mg total) by mouth every 8 (eight) hours as needed for nausea or vomiting. 24 tablet Raul Torrance, [place] C, PA-C     Controlled Substance Prescriptions [place] Controlled Substance Registry consulted? Not Applicable   Janith Lima, [place] 06/22/18 1048

## 2018-07-02 ENCOUNTER — Other Ambulatory Visit: Payer: Self-pay

## 2018-07-02 ENCOUNTER — Ambulatory Visit (INDEPENDENT_AMBULATORY_CARE_PROVIDER_SITE_OTHER): Payer: 59 | Admitting: Emergency Medicine

## 2018-07-02 ENCOUNTER — Encounter: Payer: 59 | Admitting: Emergency Medicine

## 2018-07-02 ENCOUNTER — Ambulatory Visit: Payer: 59 | Admitting: Emergency Medicine

## 2018-07-02 DIAGNOSIS — Z3491 Encounter for supervision of normal pregnancy, unspecified, first trimester: Secondary | ICD-10-CM

## 2018-07-02 DIAGNOSIS — Z3490 Encounter for supervision of normal pregnancy, unspecified, unspecified trimester: Secondary | ICD-10-CM | POA: Insufficient documentation

## 2018-07-02 MED ORDER — METRONIDAZOLE 500 MG PO TABS: 500.0000 mg | ORAL_TABLET | Freq: Two times a day (BID) | ORAL | 0 refills | Status: AC

## 2018-07-02 NOTE — Progress Notes (Signed)
Opened in error

## 2018-07-02 NOTE — Progress Notes (Signed)
I connected with  Elaine Stewart on 07/02/18 at 9:40 by telephone and verified that I am speaking with the correct person using two identifiers.   I discussed the limitations, risks, security and privacy concerns of performing an evaluation and management service by telephone and the availability of in person appointments. I also discussed with the patient that there may be a patient responsible charge related to this service. The patient expressed understanding and agreed to proceed.  New OB intake completed. Pt was signed up for babyscripts optimization. Per chart review pt was positive results for BV on 6/3 and pt currently reports white discharge with itching. Per protocol pt was sent a prescription for metronidazole 500 mg BID for 7 days. Pt was informed that first visit would consist of labs being drawn. Pt was also informed of visitor restrictions and mask requirements. Pt verbalized understanding and had no further questions.   Loma Sousa, RN 07/02/2018  11:42 AM

## 2018-07-16 ENCOUNTER — Encounter: Payer: Self-pay | Admitting: *Deleted

## 2018-07-16 ENCOUNTER — Other Ambulatory Visit: Payer: Self-pay

## 2018-07-16 ENCOUNTER — Encounter: Payer: Self-pay | Admitting: Obstetrics & Gynecology

## 2018-07-16 ENCOUNTER — Ambulatory Visit (INDEPENDENT_AMBULATORY_CARE_PROVIDER_SITE_OTHER): Payer: 59 | Admitting: Obstetrics & Gynecology

## 2018-07-16 ENCOUNTER — Other Ambulatory Visit (HOSPITAL_COMMUNITY)
Admission: RE | Admit: 2018-07-16 | Discharge: 2018-07-16 | Disposition: A | Discharge status: Home / Self Care | Payer: 59 | Source: Ambulatory Visit | Attending: Obstetrics & Gynecology | Admitting: Obstetrics & Gynecology

## 2018-07-16 DIAGNOSIS — Z3491 Encounter for supervision of normal pregnancy, unspecified, first trimester: Secondary | ICD-10-CM | POA: Insufficient documentation

## 2018-07-16 DIAGNOSIS — Z3A10 10 weeks gestation of pregnancy: Secondary | ICD-10-CM

## 2018-07-16 MED ORDER — PRENATAL PLUS 27-1 MG PO TABS: 1.0000 | ORAL_TABLET | Freq: Every day | ORAL | 6 refills | Status: DC

## 2018-07-16 MED ORDER — ONDANSETRON 4 MG PO TBDP: 4.0000 mg | ORAL_TABLET | Freq: Three times a day (TID) | ORAL | 0 refills | Status: DC | PRN

## 2018-07-16 NOTE — Progress Notes (Signed)
  Subjective:    Elaine Stewart is a G3P1011 [redacted]w[redacted]d being seen today for her first obstetrical visit.  Her obstetrical history is significant for previous gestational HTN at term. Patient does intend to breast feed. Pregnancy history fully reviewed.  Patient reports nausea.  Vitals:   07/16/18 0908  BP: 106/70  Pulse: 76  Temp: 98 F (36.7 C)  Weight: 126 lb 3.2 oz (57.2 kg)    HISTORY: OB History  Gravida Para Term Preterm AB Living  3 1 1  0 1 1  SAB TAB Ectopic Multiple Live Births  1 0 0 0 1    # Outcome Date GA Lbr Len/2nd Weight Sex Delivery Anes PTL Lv  3 Current           2 Term 07/18/14 [redacted]w[redacted]d  6 lb 7 oz (2.92 kg) M Vag-Spont None N LIV  1 SAB            Past Medical History:  Diagnosis Date  . Acne 09/27/2010  . Arthritis   . Asthma affecting pregnancy, antepartum 05/07/2014  . BV (bacterial vaginosis) 01/13/2014  . GBS (group B streptococcus) UTI complicating pregnancy 8/33/8250   Past Surgical History:  Procedure Laterality Date  . NO PAST SURGERIES     Family History  Problem Relation Age of Onset  . Healthy Mother   . Asthma Mother   . Healthy Father   . Diabetes Maternal Grandmother   . Diabetes Maternal Grandfather   . Diabetes Paternal Grandmother   . Diabetes Paternal Grandfather      Exam    Uterus:     Pelvic Exam:    Perineum: No Hemorrhoids   Vulva: normal   Vagina:  normal mucosa   pH:    Cervix: no lesions   Adnexa: normal adnexa   Bony Pelvis: average  System: Breast:  normal appearance, no masses or tenderness   Skin: normal coloration and turgor, no rashes    Neurologic: oriented, normal mood   Extremities: normal strength, tone, and muscle mass   HEENT PERRLA and extra ocular movement intact   Mouth/Teeth mucous membranes moist, pharynx normal without lesions   Neck supple   Cardiovascular: regular rate and rhythm, no murmurs or gallops   Respiratory:  appears well, vitals normal, no respiratory distress, acyanotic, normal RR,  neck free of mass or lymphadenopathy, chest clear, no wheezing, crepitations, rhonchi, normal symmetric air entry   Abdomen: soft, non-tender; bowel sounds normal; no masses,  no organomegaly   Urinary: urethral meatus normal      Assessment:    Pregnancy: [id number] Patient Active Problem List   Diagnosis Date Noted  . Supervision of low-risk pregnancy 07/02/2018  . Anemia 12/05/2015  . Polyarticular juvenile rheumatoid arthritis, chronic ([place]) 09/29/2010  . Rhinitis, allergic 09/29/2010  . Hypertrophy of tonsils and adenoids 09/27/2010        Plan:     Initial labs drawn. Prenatal vitamins. Problem list reviewed and updated. Genetic Screening discussed Panorama and Horizon ordered   Ultrasound discussed; fetal survey: ordered.  Follow up in 8 weeks. 50% of 30 min visit spent on counseling and coordination of care.  Baby scripts   Emeterio Reeve 07/16/2018

## 2018-07-16 NOTE — Patient Instructions (Signed)

## 2018-07-17 LAB — GC/CHLAMYDIA PROBE AMP (~~LOC~~) NOT AT ARMC
Chlamydia: NEGATIVE
Neisseria Gonorrhea: NEGATIVE

## 2018-07-18 LAB — OBSTETRIC PANEL, INCLUDING HIV
Basophils Absolute: 0 10*3/uL (ref 0.0–0.2)
Basos: 0 %
EOS (ABSOLUTE): 0 10*3/uL (ref 0.0–0.4)
Eos: 0 %
HIV Screen 4th Generation wRfx: NONREACTIVE
Hematocrit: 36.9 % (ref 34.0–46.6)
Hemoglobin: 12.5 g/dL (ref 11.1–15.9)
Hepatitis B Surface Ag: NEGATIVE
Immature Grans (Abs): 0 10*3/uL (ref 0.0–0.1)
Immature Granulocytes: 0 %
Lymphocytes Absolute: 1.6 10*3/uL (ref 0.7–3.1)
Lymphs: 26 %
MCH: 29.2 pg (ref 26.6–33.0)
MCHC: 33.9 g/dL (ref 31.5–35.7)
MCV: 86 fL (ref 79–97)
Monocytes Absolute: 0.3 10*3/uL (ref 0.1–0.9)
Monocytes: 5 %
Neutrophils Absolute: 4.1 10*3/uL (ref 1.4–7.0)
Neutrophils: 69 %
Platelets: 298 10*3/uL (ref 150–450)
RBC: 4.28 x10E6/uL (ref 3.77–5.28)
RDW: 12.9 % (ref 11.7–15.4)
RPR Ser Ql: NONREACTIVE
Rh Factor: POSITIVE
Rubella Antibodies, IGG: 1.47 index (ref >0.99)
WBC: 6.1 10*3/uL (ref 3.4–10.8)

## 2018-07-18 LAB — CULTURE, OB URINE

## 2018-07-18 LAB — AB SCR+ANTIBODY ID: Antibody Screen: POSITIVE — AB

## 2018-07-18 LAB — URINE CULTURE, OB REFLEX

## 2018-07-28 ENCOUNTER — Encounter (HOSPITAL_COMMUNITY): Payer: Self-pay | Admitting: *Deleted

## 2018-07-28 ENCOUNTER — Inpatient Hospital Stay (HOSPITAL_COMMUNITY)
Admission: AD | Admit: 2018-07-28 | Discharge: 2018-07-28 | Disposition: A | Discharge status: Home / Self Care | Payer: 59 | Attending: Obstetrics and Gynecology | Admitting: Obstetrics and Gynecology

## 2018-07-28 ENCOUNTER — Other Ambulatory Visit: Payer: Self-pay

## 2018-07-28 DIAGNOSIS — O26891 Other specified pregnancy related conditions, first trimester: Secondary | ICD-10-CM | POA: Insufficient documentation

## 2018-07-28 DIAGNOSIS — O210 Mild hyperemesis gravidarum: Secondary | ICD-10-CM | POA: Diagnosis not present

## 2018-07-28 DIAGNOSIS — O219 Vomiting of pregnancy, unspecified: Secondary | ICD-10-CM | POA: Diagnosis not present

## 2018-07-28 DIAGNOSIS — O23591 Infection of other part of genital tract in pregnancy, first trimester: Secondary | ICD-10-CM | POA: Insufficient documentation

## 2018-07-28 DIAGNOSIS — Z3A12 12 weeks gestation of pregnancy: Secondary | ICD-10-CM | POA: Diagnosis not present

## 2018-07-28 DIAGNOSIS — Z880 Allergy status to penicillin: Secondary | ICD-10-CM | POA: Diagnosis not present

## 2018-07-28 DIAGNOSIS — K5903 Drug induced constipation: Secondary | ICD-10-CM | POA: Insufficient documentation

## 2018-07-28 DIAGNOSIS — N760 Acute vaginitis: Secondary | ICD-10-CM

## 2018-07-28 LAB — URINALYSIS, ROUTINE W REFLEX MICROSCOPIC
Bilirubin Urine: NEGATIVE
Glucose, UA: NEGATIVE mg/dL
Ketones, ur: 20 mg/dL — AB
Nitrite: NEGATIVE
Protein, ur: 100 mg/dL — AB
Specific Gravity, Urine: 1.031 — ABNORMAL HIGH (ref 1.005–1.030)
WBC, UA: >50 WBC/hpf — ABNORMAL HIGH (ref 0–5)
pH: 5 (ref 5.0–8.0)

## 2018-07-28 LAB — WET PREP, GENITAL
Sperm: NONE SEEN
Trich, Wet Prep: NONE SEEN
Yeast Wet Prep HPF POC: NONE SEEN

## 2018-07-28 MED ORDER — PROMETHAZINE HCL 25 MG PO TABS: 25.0000 mg | ORAL_TABLET | Freq: Four times a day (QID) | ORAL | 1 refills | Status: DC | PRN

## 2018-07-28 MED ORDER — ONDANSETRON 4 MG PO TBDP
8.0000 mg | ORAL_TABLET | Freq: Once | ORAL | Status: AC
  Administered 2018-07-28: 8 mg via ORAL
  Filled 2018-07-28 (×2): qty 2

## 2018-07-28 MED ORDER — ONDANSETRON 4 MG PO TBDP: 4.0000 mg | ORAL_TABLET | Freq: Three times a day (TID) | ORAL | 1 refills | Status: DC | PRN

## 2018-07-28 MED ORDER — DOCUSATE SODIUM 100 MG PO CAPS: 100.0000 mg | ORAL_CAPSULE | Freq: Two times a day (BID) | ORAL | 0 refills | Status: DC

## 2018-07-28 MED ORDER — TERCONAZOLE 0.8 % VA CREA: 1.0000 | TOPICAL_CREAM | Freq: Every day | VAGINAL | 0 refills | Status: DC

## 2018-07-28 NOTE — MAU Note (Signed)
Tunisha Ruland is a 23 y.o. at [redacted]w[redacted]d here in MAU reporting:  +N/V Endorses OB told her to come  vomiting 10-11 times in the past 24 hours. States not able to keep anything down. Has noticed blood in emesis.  States she ran out of zofran. Per patient she called them yesterday and they would not refill. Patient states she was told to come to MAU then. Pain score: denies Vaginal bleeding: denies Vitals:   07/28/18 1109  BP: 126/68  Pulse: (!) 111  Resp: 16  Temp: 98.4 F (36.9 C)  SpO2: 98%     Lab orders placed from triage: ua

## 2018-07-28 NOTE — Discharge Instructions (Signed)
Morning Sickness  Morning sickness is when a woman feels nauseous during pregnancy. This nauseous feeling may or may not come with vomiting. It often occurs in the morning, but it can be a problem at any time of day. Morning sickness is most common during the first trimester. In some cases, it may continue throughout pregnancy. Although morning sickness is unpleasant, it is usually harmless unless the woman develops severe and continual vomiting (hyperemesis gravidarum), a condition that requires more intense treatment. What are the causes? The exact cause of this condition is not known, but it seems to be related to normal hormonal changes that occur in pregnancy. What increases the risk? You are more likely to develop this condition if:  You experienced nausea or vomiting before your pregnancy.  You had morning sickness during a previous pregnancy.  You are pregnant with more than one baby, such as twins. What are the signs or symptoms? Symptoms of this condition include:  Nausea.  Vomiting. How is this diagnosed? This condition is usually diagnosed based on your signs and symptoms. How is this treated? In many cases, treatment is not needed for this condition. Making some changes to what you eat may help to control symptoms. Your health care provider may also prescribe or recommend:  Vitamin B6 supplements.  Anti-nausea medicines.  Ginger. Follow these instructions at home: Medicines  Take over-the-counter and prescription medicines only as told by your health care provider. Do not use any prescription, over-the-counter, or herbal medicines for morning sickness without first talking with your health care provider.  Taking multivitamins before getting pregnant can prevent or decrease the severity of morning sickness in most women. Eating and drinking  Eat a piece of dry toast or crackers before getting out of bed in the morning.  Eat 5 or 6 small meals a day.  Eat dry and  bland foods, such as rice or a baked potato. Foods that are high in carbohydrates are often helpful.  Avoid greasy, fatty, and spicy foods.  Have someone cook for you if the smell of any food causes nausea and vomiting.  If you feel nauseous after taking prenatal vitamins, take the vitamins at night or with a snack.  Snack on protein foods between meals if you are hungry. Nuts, yogurt, and cheese are good options.  Drink fluids throughout the day.  Try ginger ale made with real ginger, ginger tea made from fresh grated ginger, or ginger candies. General instructions  Do not use any products that contain nicotine or tobacco, such as cigarettes and e-cigarettes. If you need help quitting, ask your health care provider.  Get an air purifier to keep the air in your house free of odors.  Get plenty of fresh air.  Try to avoid odors that trigger your nausea.  Consider trying these methods to help relieve symptoms: ? Wearing an acupressure wristband. These wristbands are often worn for seasickness. ? Acupuncture. Contact a health care provider if:  Your home remedies are not working and you need medicine.  You feel dizzy or light-headed.  You are losing weight. Get help right away if:  You have persistent and uncontrolled nausea and vomiting.  You faint.  You have severe pain in your abdomen. Summary  Morning sickness is when a woman feels nauseous during pregnancy. This nauseous feeling may or may not come with vomiting.  Morning sickness is most common during the first trimester.  It often occurs in the morning, but it can be a problem at  any time of day.  In many cases, treatment is not needed for this condition. Making some changes to what you eat may help to control symptoms. This information is not intended to replace advice given to you by your health care provider. Make sure you discuss any questions you have with your health care provider. Document Released:  02/10/2006 Document Revised: 12/02/2016 Document Reviewed: 01/23/2016 Elsevier Patient Education  2020 [employer].     Constipation, Adult Constipation is when a person has fewer bowel movements in a week than normal, has difficulty having a bowel movement, or has stools that are dry, hard, or larger than normal. Constipation may be caused by an underlying condition. It may become worse with age if a person takes certain medicines and does not take in enough fluids. Follow these instructions at home: Eating and drinking   Eat foods that have a lot of fiber, such as fresh fruits and vegetables, whole grains, and beans.  Limit foods that are high in fat, low in fiber, or overly processed, such as french fries, hamburgers, cookies, candies, and soda.  Drink enough fluid to keep your urine clear or pale yellow. General instructions  Exercise regularly or as told by your health care provider.  Go to the restroom when you have the urge to go. Do not hold it in.  Take over-the-counter and prescription medicines only as told by your health care provider. These include any fiber supplements.  Practice pelvic floor retraining exercises, such as deep breathing while relaxing the lower abdomen and pelvic floor relaxation during bowel movements.  Watch your condition for any changes.  Keep all follow-up visits as told by your health care provider. This is important. Contact a health care provider if:  You have pain that gets worse.  You have a fever.  You do not have a bowel movement after 4 days.  You vomit.  You are not hungry.  You lose weight.  You are bleeding from the anus.  You have thin, pencil-like stools. Get help right away if:  You have a fever and your symptoms suddenly get worse.  You leak stool or have blood in your stool.  Your abdomen is bloated.  You have severe pain in your abdomen.  You feel dizzy or you faint. This information is not intended to  replace advice given to you by your health care provider. Make sure you discuss any questions you have with your health care provider. Document Released: 09/18/2003 Document Revised: 12/02/2016 Document Reviewed: 06/10/2015 Elsevier Patient Education  2020 [employer].

## 2018-07-28 NOTE — MAU Provider Note (Signed)
Chief Complaint: Emesis   First Provider Initiated Contact with Patient 07/28/18 1126     SUBJECTIVE HPI: Elaine ShieldsJaida Stewart is a 23 y.o. G3P1011 at 4668w4d who presents to [facility] Admissions reporting nausea & vomiting. Was taking zofran for symptoms with good relief. Ran out of her zofran last week. Was vomiting ~4 times per day until last night. States she has vomited 11 times in the last 24 hours & has been unable to keep anything down. Denies abdominal pain or vaginal bleeding. Feels some vaginal irritation & thinks she is getting a yeast infection due to dehydration. Has not noticed change in vaginal discharge. Last BM was 4 days ago.   Past Medical History:  Diagnosis Date  . Acne 09/27/2010  . Arthritis   . Asthma affecting pregnancy, antepartum 05/07/2014  . BV (bacterial vaginosis) 01/13/2014  . GBS (group B streptococcus) UTI complicating pregnancy 01/13/2014   OB History  Gravida Para Term Preterm AB Living  3 1 1  0 1 1  SAB TAB Ectopic Multiple Live Births  1 0 0 0 1    # Outcome Date GA Lbr Len/2nd Weight Sex Delivery Anes PTL Lv  3 Current           2 Term 07/18/14 1971w5d  2920 g M Vag-Spont None N LIV  1 SAB            Past Surgical History:  Procedure Laterality Date  . NO PAST SURGERIES     Social History   Socioeconomic History  . Marital status: Single    Spouse name: Not on file  . Number of children: 1  . Years of education: 12+  . Highest education level: Not on file  Occupational History  . Occupation: Conservation officer, naturecashier  . Occupation: child care  . Occupation: Camera operatorware[employer]  Social Needs  . Financial resource strain: Not on file  . Food insecurity    Worry: Not on file    Inability: Not on file  . Transportation needs    Medical: Not on file    Non-medical: Not on file  Tobacco Use  . Smoking status: Never Smoker  . Smokeless tobacco: Never Used  Substance and Sexual Activity  . Alcohol use: No  . Drug use: No  . Sexual activity: Yes    Partners: Male    Birth  control/protection: None  Lifestyle  . Physical activity    Days per week: Not on file    Minutes per session: Not on file  . Stress: Not on file  Relationships  . Social Musicianconnections    Talks on phone: Not on file    Gets together: Not on file    Attends religious service: Not on file    Active member of club or organization: Not on file    Attends meetings of clubs or organizations: Not on file    Relationship status: Not on file  . Intimate partner violence    Fear of current or ex partner: Not on file    Emotionally abused: Not on file    Physically abused: Not on file    Forced sexual activity: Not on file  Other Topics Concern  . Not on file  Social History Narrative   Student at [employer]. Mostly online courses.   Lives with her mother and her son (07/2014).   Family History  Problem Relation Age of Onset  . Healthy Mother   . Asthma Mother   . Healthy Father   . Diabetes Maternal Grandmother   .  Diabetes Maternal Grandfather   . Diabetes Paternal Grandmother   . Diabetes Paternal Grandfather    No current facility-administered medications on file prior to encounter.    Current Outpatient Medications on File Prior to Encounter  Medication Sig Dispense Refill  . prenatal vitamin w/FE, FA (PRENATAL 1 + 1) 27-1 MG TABS tablet Take 1 tablet by mouth daily at 12 noon. 30 tablet 6   Allergies  Allergen Reactions  . Penicillins Hives    Has patient had a PCN reaction causing immediate rash, facial/tongue/throat swelling, SOB or lightheadedness with hypotension: Yes Has patient had a PCN reaction causing severe rash involving mucus membranes or skin necrosis: No Has patient had a PCN reaction that required hospitalization: No Has patient had a PCN reaction occurring within the last 10 years: Yes If all of the above answers are "NO", then may proceed with Cephalosporin use.     I have reviewed patient's Past Medical Hx, Surgical Hx, Family Hx, Social Hx, medications and  allergies.   Review of Systems  Constitutional: Negative.   Gastrointestinal: Positive for constipation, nausea and vomiting. Negative for abdominal pain and diarrhea.  Genitourinary: Negative for vaginal bleeding and vaginal discharge.    OBJECTIVE Patient Vitals for the past 24 hrs:  BP Temp Temp src Pulse Resp SpO2 Weight  07/28/18 1315 108/64 - - 88 16 - -  07/28/18 1109 126/68 98.4 F (36.9 C) Oral (!) 111 16 98 % 56.3 kg   Constitutional: Well-developed, well-nourished female in no acute distress.  Cardiovascular: normal rate & rhythm, no murmur Respiratory: normal rate and effort. Lung sounds clear throughout GI: Abd soft, non-tender, Pos BS x 4. No guarding or rebound tenderness MS: Extremities nontender, no edema, normal ROM Neurologic: Alert and oriented x 4.   LAB RESULTS Results for orders placed or performed during the hospital encounter of 07/28/18 (from the past 24 hour(s))  Urinalysis, Routine w reflex microscopic     Status: Abnormal   Collection Time: 07/28/18 11:15 AM  Result Value Ref Range   Color, Urine AMBER (A) YELLOW   APPearance CLOUDY (A) CLEAR   Specific Gravity, Urine 1.031 (H) 1.005 - 1.030   pH 5.0 5.0 - 8.0   Glucose, UA NEGATIVE NEGATIVE mg/dL   Hgb urine dipstick MODERATE (A) NEGATIVE   Bilirubin Urine NEGATIVE NEGATIVE   Ketones, ur 20 (A) NEGATIVE mg/dL   Protein, ur 161100 (A) NEGATIVE mg/dL   Nitrite NEGATIVE NEGATIVE   Leukocytes,Ua LARGE (A) NEGATIVE   RBC / HPF 21-50 0 - 5 RBC/hpf   WBC, UA >50 (H) 0 - 5 WBC/hpf   Bacteria, UA MANY (A) NONE SEEN   Squamous Epithelial / LPF 21-50 0 - 5   WBC Clumps PRESENT    Mucus PRESENT   Wet prep, genital     Status: Abnormal   Collection Time: 07/28/18 11:48 AM  Result Value Ref Range   Yeast Wet Prep HPF POC NONE SEEN NONE SEEN   Trich, Wet Prep NONE SEEN NONE SEEN   Clue Cells Wet Prep HPF POC PRESENT (A) NONE SEEN   WBC, Wet Prep HPF POC MODERATE (A) NONE SEEN   Sperm NONE SEEN      IMAGING No results found.  MAU COURSE Orders Placed This Encounter  Procedures  . Wet prep, genital  . Urinalysis, Routine w reflex microscopic  . Discharge patient   Meds ordered this encounter  Medications  . ondansetron (ZOFRAN-ODT) disintegrating tablet 8 mg  . ondansetron (ZOFRAN  ODT) 4 MG disintegrating tablet    Sig: Take 1 tablet (4 mg total) by mouth every 8 (eight) hours as needed for nausea or vomiting.    Dispense:  20 tablet    Refill:  1    Order Specific Question:   Supervising Provider    Answer:   ERVIN, MICHAEL L [[id number]]  . promethazine (PHENERGAN) 25 MG tablet    Sig: Take 1 tablet (25 mg total) by mouth every 6 (six) hours as needed for nausea or vomiting.    Dispense:  30 tablet    Refill:  1    Order Specific Question:   Supervising Provider    Answer:   ERVIN, MICHAEL L [[id number]]  . docusate sodium (COLACE) 100 MG capsule    Sig: Take 1 capsule (100 mg total) by mouth 2 (two) times daily.    Dispense:  60 capsule    Refill:  0    Order Specific Question:   Supervising Provider    Answer:   ERVIN, MICHAEL L [[id number]]  . terconazole (TERAZOL 3) 0.8 % vaginal cream    Sig: Place 1 applicator vaginally at bedtime.    Dispense:  20 g    Refill:  0    Order Specific Question:   Supervising Provider    Answer:   Rip Harbour, MICHAEL L [[id number]]    MDM FHT present via doppler  U/a with ketones. Offered IV fluids to patient. She declines. States she only wants some zofran.  Zofran 8 mg odt given. Patient not vomiting in MAU & wants to be discharged. Able to keep down crackers and drink.  Will d/c home with rx for zofran & phenergan. Discussed decreasing use of zofran d/t constipation. Will also send in colace.   Wet prep negative for yeast. Rx terazol sent in for worsening symptoms. + clue cells, but denies any discharge. 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  ASSESSMENT 1. Nausea and vomiting during pregnancy prior to [redacted] weeks gestation    2. [redacted] weeks gestation of pregnancy   3. Acute vaginitis   4. Drug-induced constipation     PLAN Discharge home in stable condition. Discussed reasons to return to MAU Rx zofran, phenergan, colace, & terazol  Follow-up Information    [facility] Follow up.   Specialty: Obstetrics and Gynecology Why: return for worsening symptoms Contact information: [address] [id number] [place] [phone]         Allergies as of 07/28/2018      Reactions   Penicillins Hives   Has patient had a PCN reaction causing immediate rash, facial/tongue/throat swelling, SOB or lightheadedness with hypotension: Yes Has patient had a PCN reaction causing severe rash involving mucus membranes or skin necrosis: No Has patient had a PCN reaction that required hospitalization: No Has patient had a PCN reaction occurring within the last 10 years: Yes If all of the above answers are "NO", then may proceed with Cephalosporin use.      Medication List    TAKE these medications   docusate sodium 100 MG capsule Commonly known as: COLACE Take 1 capsule (100 mg total) by mouth 2 (two) times daily.   ondansetron 4 MG disintegrating tablet Commonly known as: Zofran ODT Take 1 tablet (4 mg total) by mouth every 8 (eight) hours as needed for nausea or vomiting.   prenatal vitamin w/FE, FA 27-1 MG Tabs tablet Take 1 tablet by mouth daily  at 12 noon.   promethazine 25 MG tablet Commonly known as: PHENERGAN Take 1 tablet (25 mg total) by mouth every 6 (six) hours as needed for nausea or vomiting.   terconazole 0.8 % vaginal cream Commonly known as: Terazol 3 Place 1 applicator vaginally at bedtime.        Judeth HornLawrence, Chiara Coltrin, NP 07/28/2018  2:39 PM

## 2018-07-28 NOTE — Progress Notes (Signed)
Pt states she is no longer nauseated, gave her crackers and apple juice to try to keep down.

## 2018-08-03 ENCOUNTER — Encounter: Payer: Self-pay | Admitting: *Deleted

## 2018-08-03 DIAGNOSIS — Z3491 Encounter for supervision of normal pregnancy, unspecified, first trimester: Secondary | ICD-10-CM

## 2018-08-09 IMAGING — DX DG CHEST 2V
2 series · 2 of 2 positions shown · non-contrast
Comparison: None.

CLINICAL DATA: Fever with chest wall pain and shortness-of-breath.

EXAM:
CHEST - 2 VIEW

[w chest pa]
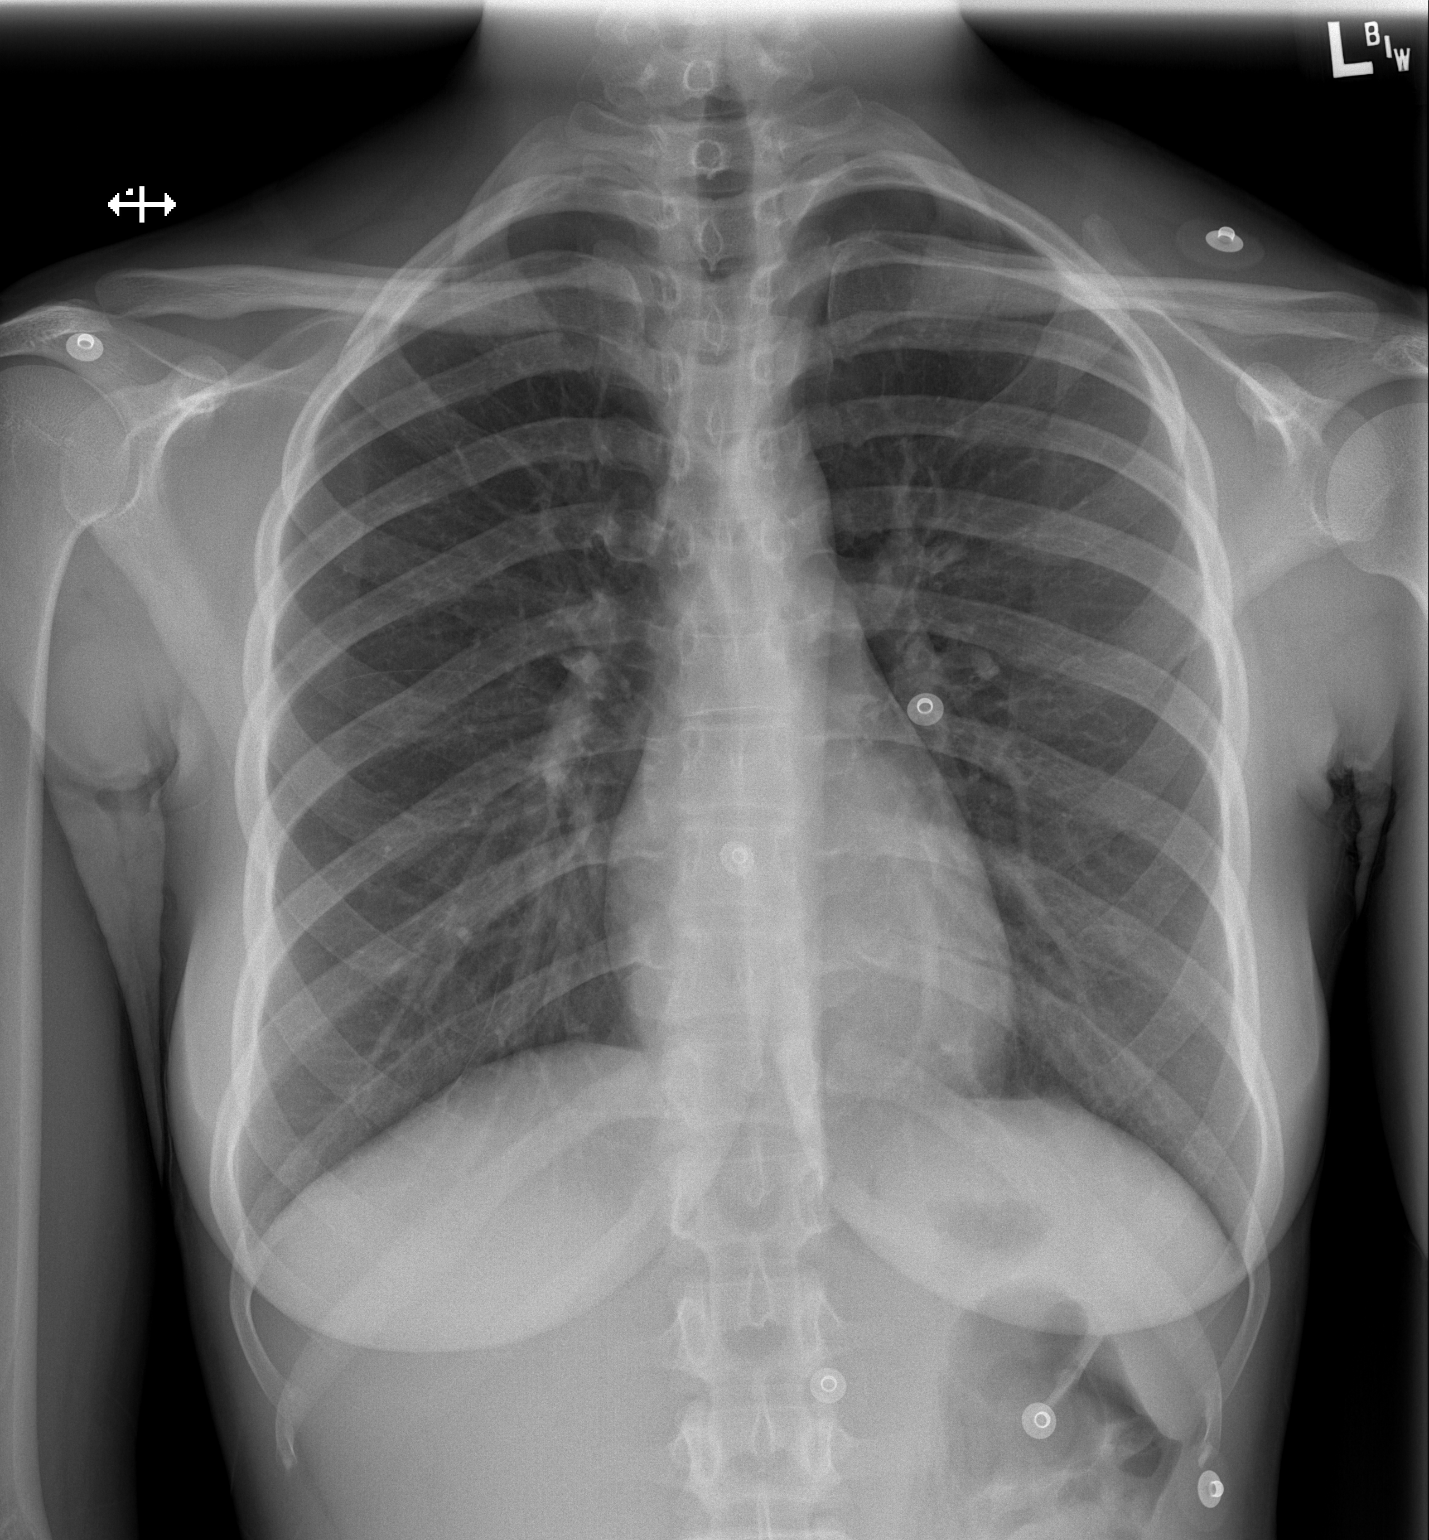

[w chest lat]
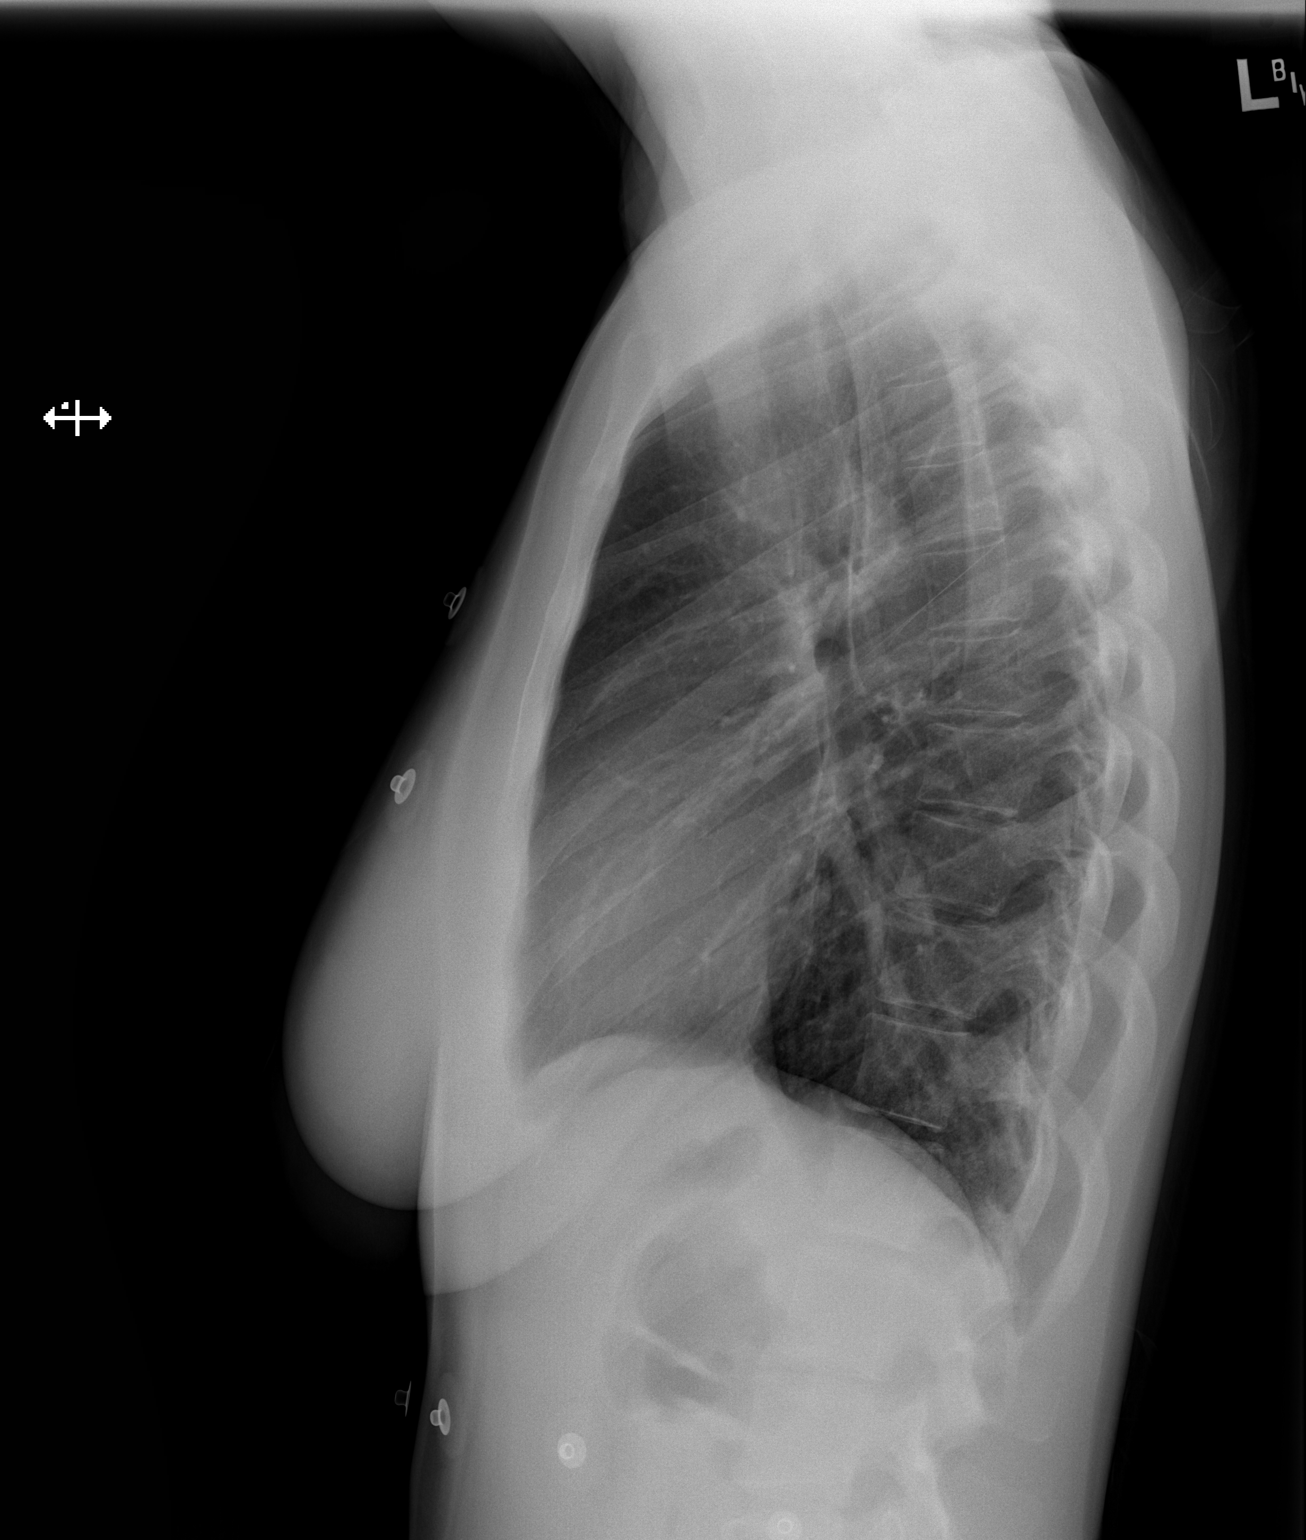

[2 of 2 positions shown; findings below may reference images not displayed]

FINDINGS: The heart size and mediastinal contours are within normal limits.
Both lungs are clear. The visualized skeletal structures are
unremarkable.
IMPRESSION: No active cardiopulmonary disease.

## 2018-08-13 ENCOUNTER — Encounter: Payer: Self-pay | Admitting: Obstetrics and Gynecology

## 2018-08-13 ENCOUNTER — Other Ambulatory Visit: Payer: Self-pay

## 2018-08-13 ENCOUNTER — Encounter: Payer: Self-pay | Admitting: *Deleted

## 2018-08-13 ENCOUNTER — Telehealth: Payer: 59 | Admitting: Certified Nurse Midwife

## 2018-08-13 ENCOUNTER — Telehealth: Payer: 59 | Admitting: Obstetrics and Gynecology

## 2018-08-13 DIAGNOSIS — Z3491 Encounter for supervision of normal pregnancy, unspecified, first trimester: Secondary | ICD-10-CM

## 2018-08-13 NOTE — Progress Notes (Signed)
Patient will be called to reschedule

## 2018-08-13 NOTE — Progress Notes (Signed)
1540-attempted to call pt to start virtual visit. Voicemail box not set up at this time. Will attempt again.   1553- 2nd attempt. Pt did not answer.

## 2018-08-16 ENCOUNTER — Telehealth: Payer: 59 | Admitting: Family

## 2018-08-16 DIAGNOSIS — N760 Acute vaginitis: Secondary | ICD-10-CM

## 2018-08-16 NOTE — Progress Notes (Signed)
We are sorry that you are not feeling well. Here is how we plan to help! Based on what you shared with me it looks like you: May have a yeast vaginosis  Vaginosis is an inflammation of the vagina that can result in discharge, itching and pain. The cause is usually a change in the normal balance of vaginal bacteria or an infection. Vaginosis can also result from reduced estrogen levels after menopause.  The most common causes of vaginosis are:   Bacterial vaginosis which results from an overgrowth of one on several organisms that are normally present in your vagina.   Yeast infections which are caused by a naturally occurring fungus called candida.   Vaginal atrophy (atrophic vaginosis) which results from the thinning of the vagina from reduced estrogen levels after menopause.   Trichomoniasis which is caused by a parasite and is commonly transmitted by sexual intercourse.  Factors that increase your risk of developing vaginosis include: Marland Kitchen Medications, such as antibiotics and steroids . Uncontrolled diabetes . Use of hygiene products such as bubble bath, vaginal spray or vaginal deodorant . Douching . Wearing damp or tight-fitting clothing . Using an intrauterine device (IUD) for birth control . Hormonal changes, such as those associated with pregnancy, birth control pills or menopause . Sexual activity . Having a sexually transmitted infection  Your treatment plan is Monistat (miconazole) or Gyne-Lotrimin (clotrimazole) over the counter at most phamacies.  Be sure to take all of the medication as directed. Stop taking any medication if you develop a rash, tongue swelling or shortness of breath. Mothers who are breast feeding should consider pumping and discarding their breast milk while on these antibiotics. However, there is no consensus that infant exposure at these doses would be harmful.  Remember that medication creams can weaken latex condoms. Marland Kitchen   HOME CARE:  Good hygiene may  prevent some types of vaginosis from recurring and may relieve some symptoms:  . Avoid baths, hot tubs and whirlpool spas. Rinse soap from your outer genital area after a shower, and dry the area well to prevent irritation. Don't use scented or harsh soaps, such as those with deodorant or antibacterial action. Marland Kitchen Avoid irritants. These include scented tampons and pads. . Wipe from front to back after using the toilet. Doing so avoids spreading fecal bacteria to your vagina.  Other things that may help prevent vaginosis include:  Marland Kitchen Don't douche. Your vagina doesn't require cleansing other than normal bathing. Repetitive douching disrupts the normal organisms that reside in the vagina and can actually increase your risk of vaginal infection. Douching won't clear up a vaginal infection. . Use a latex condom. Both female and female latex condoms may help you avoid infections spread by sexual contact. . Wear cotton underwear. Also wear pantyhose with a cotton crotch. If you feel comfortable without it, skip wearing underwear to bed. Yeast thrives in [employer] Your symptoms should improve in the next day or two.  GET HELP RIGHT AWAY IF:  . You have pain in your lower abdomen ( pelvic area or over your ovaries) . You develop nausea or vomiting . You develop a fever . Your discharge changes or worsens . You have persistent pain with intercourse . You develop shortness of breath, a rapid pulse, or you faint.  These symptoms could be signs of problems or infections that need to be evaluated by a medical provider now.  MAKE SURE YOU    Understand these instructions.  Will watch your condition.  Will get help right away if you are not doing well or get worse.  Your e-visit answers were reviewed by a board certified advanced clinical practitioner to complete your personal care plan. Depending upon the condition, your plan could have included both over the counter or prescription  medications. Please review your pharmacy choice to make sure that you have choses a pharmacy that is open for you to pick up any needed prescription, Your safety is important to [place]. If you have drug allergies check your prescription carefully.   You can use MyChart to ask questions about today's visit, request a non-urgent call back, or ask for a work or school excuse for 24 hours related to this e-Visit. If it has been greater than 24 hours you will need to follow up with your provider, or enter a new e-Visit to address those concerns. You will get a MyChart message within the next two days asking about your experience. I hope that your e-visit has been valuable and will speed your recovery.  Greater than 5 minutes, yet less than 10 minutes of time have been spent researching, coordinating, and implementing care for this patient today.  Thank you for the details you included in the comment boxes. Those details are very helpful in determining the best course of treatment for you and help [place] to provide the best care.

## 2018-08-30 ENCOUNTER — Encounter: Payer: Self-pay | Admitting: *Deleted

## 2018-08-30 ENCOUNTER — Other Ambulatory Visit: Payer: Self-pay | Admitting: *Deleted

## 2018-08-30 DIAGNOSIS — Z3490 Encounter for supervision of normal pregnancy, unspecified, unspecified trimester: Secondary | ICD-10-CM

## 2018-08-30 MED ORDER — BLOOD PRESSURE KIT DEVI: 1.0000 | 0 refills | Status: DC | PRN

## 2018-09-04 ENCOUNTER — Telehealth: Payer: Self-pay | Admitting: Obstetrics and Gynecology

## 2018-09-04 ENCOUNTER — Ambulatory Visit (HOSPITAL_COMMUNITY)
Admission: RE | Admit: 2018-09-04 | Discharge: 2018-09-04 | Disposition: A | Discharge status: Home / Self Care | Payer: 59 | Source: Ambulatory Visit | Attending: Obstetrics and Gynecology | Admitting: Obstetrics and Gynecology

## 2018-09-04 ENCOUNTER — Other Ambulatory Visit: Payer: Self-pay

## 2018-09-04 DIAGNOSIS — Z3490 Encounter for supervision of normal pregnancy, unspecified, unspecified trimester: Secondary | ICD-10-CM

## 2018-09-04 DIAGNOSIS — Z363 Encounter for antenatal screening for malformations: Secondary | ICD-10-CM

## 2018-09-04 DIAGNOSIS — Z3A18 18 weeks gestation of pregnancy: Secondary | ICD-10-CM | POA: Diagnosis not present

## 2018-09-04 DIAGNOSIS — Z3491 Encounter for supervision of normal pregnancy, unspecified, first trimester: Secondary | ICD-10-CM | POA: Diagnosis not present

## 2018-09-04 NOTE — Telephone Encounter (Signed)
The patient visits [facility] on [address] per previous message she would like the prenatal vitamins and cream sent to this pharmacy.

## 2018-09-04 NOTE — Telephone Encounter (Signed)
Called pt in regards to her phone message. She reports she was informed that she had PNV called in and that she needed Zofran. Discussed with pt that she has 1 refill on her Zofran at the pharmacy. Pt voiced understanding.

## 2018-09-04 NOTE — Telephone Encounter (Signed)
The patient requested a refill on the prenatal vitamins. She also stated if we could refill the cream she used as well. She stated she doesn't use it as much but she would still like it (terconazole).

## 2018-09-22 ENCOUNTER — Telehealth: Payer: 59 | Admitting: Family

## 2018-09-22 DIAGNOSIS — Z3490 Encounter for supervision of normal pregnancy, unspecified, unspecified trimester: Secondary | ICD-10-CM

## 2018-09-22 DIAGNOSIS — N898 Other specified noninflammatory disorders of vagina: Secondary | ICD-10-CM

## 2018-09-22 NOTE — Progress Notes (Signed)
Based on what you shared with me, I feel your condition warrants further evaluation and I recommend that you be seen for a face to face office visit.  Given your symptoms and you are pregnant, you need to follow up with your GYN.    NOTE: If you entered your credit card information for this eVisit, you will not be charged. You may see a "hold" on your card for the $35 but that hold will drop off and you will not have a charge processed.  If you are having a true medical emergency please call 911.     For an urgent face to face visit, [place] has four urgent care centers for your convenience:   . [facility]    [phone]                  Get Driving Directions  [address], [place] [address] . 10 am to 8 pm Monday-Friday . 12 pm to 8 pm Saturday-Sunday   . [facility] at [place]                  Get Driving Directions  [address], [place], [place] [address] . 8 am to 8 pm Monday-Friday . 9 am to 6 pm Saturday . 11 am to 6 pm Sunday   . [facility] at [place]                  Get Driving Directions   [address] [place], [place] [address] . 8 am to 8 pm Monday-Friday . 8 am to 4 pm Saturday-Sunday    . [facility] at [place]                    Get Driving Directions  [phone]  [address] [place], [place] [address]  . Monday-Friday, 12 PM to 6 PM    Your e-visit answers were reviewed by a board certified advanced clinical practitioner to complete your personal care plan.  Thank you for using e-Visits.

## 2018-09-24 ENCOUNTER — Telehealth: Payer: Self-pay | Admitting: Family Medicine

## 2018-09-24 ENCOUNTER — Other Ambulatory Visit: Payer: Self-pay

## 2018-09-24 MED ORDER — TERCONAZOLE 0.8 % VA CREA: 1.0000 | TOPICAL_CREAM | Freq: Every day | VAGINAL | 0 refills | Status: DC

## 2018-09-24 NOTE — Telephone Encounter (Signed)
The patient called in stating she would like a refill on the terconazole cream. She visits the [facility] on [address] and [address].

## 2018-10-02 ENCOUNTER — Other Ambulatory Visit: Payer: Self-pay

## 2018-10-02 ENCOUNTER — Telehealth (INDEPENDENT_AMBULATORY_CARE_PROVIDER_SITE_OTHER): Payer: 59 | Admitting: Medical

## 2018-10-02 ENCOUNTER — Encounter: Payer: Self-pay | Admitting: Medical

## 2018-10-02 DIAGNOSIS — Z3A22 22 weeks gestation of pregnancy: Secondary | ICD-10-CM

## 2018-10-02 DIAGNOSIS — Z3490 Encounter for supervision of normal pregnancy, unspecified, unspecified trimester: Secondary | ICD-10-CM

## 2018-10-02 DIAGNOSIS — O9989 Other specified diseases and conditions complicating pregnancy, childbirth and the puerperium: Secondary | ICD-10-CM

## 2018-10-02 DIAGNOSIS — M549 Dorsalgia, unspecified: Secondary | ICD-10-CM

## 2018-10-02 NOTE — Progress Notes (Signed)
I connected with  Elaine Stewart on 10/02/18 at  4:15 PM EDT by telephone and verified that I am speaking with the correct person using two identifiers.   I discussed the limitations, risks, security and privacy concerns of performing an evaluation and management service by telephone and the availability of in person appointments. I also discussed with the patient that there may be a patient responsible charge related to this service. The patient expressed understanding and agreed to proceed.  [name], [place] 10/02/2018  4:22 PM

## 2018-10-02 NOTE — Patient Instructions (Addendum)
Second Trimester of Pregnancy  The second trimester is from week 14 through week 27 (month 4 through 6). This is often the time in pregnancy that you feel your best. Often times, morning sickness has lessened or quit. You may have more energy, and you may get hungry more often. Your unborn baby is growing rapidly. At the end of the sixth month, he or she is about 9 inches long and weighs about 1 pounds. You will likely feel the baby move between 18 and 20 weeks of pregnancy. Follow these instructions at home: Medicines  Take over-the-counter and prescription medicines only as told by your doctor. Some medicines are safe and some medicines are not safe during pregnancy.  Take a prenatal vitamin that contains at least 600 micrograms (mcg) of folic acid.  If you have trouble pooping (constipation), take medicine that will make your stool soft (stool softener) if your doctor approves. Eating and drinking   Eat regular, healthy meals.  Avoid raw meat and uncooked cheese.  If you get low calcium from the food you eat, talk to your doctor about taking a daily calcium supplement.  Avoid foods that are high in fat and sugars, such as fried and sweet foods.  If you feel sick to your stomach (nauseous) or throw up (vomit): ? Eat 4 or 5 small meals a day instead of 3 large meals. ? Try eating a few soda crackers. ? Drink liquids between meals instead of during meals.  To prevent constipation: ? Eat foods that are high in fiber, like fresh fruits and vegetables, whole grains, and beans. ? Drink enough fluids to keep your pee (urine) clear or pale yellow. Activity  Exercise only as told by your doctor. Stop exercising if you start to have cramps.  Do not exercise if it is too hot, too humid, or if you are in a place of great height (high altitude).  Avoid heavy lifting.  Wear low-heeled shoes. Sit and stand up straight.  You can continue to have sex unless your doctor tells you not  to. Relieving pain and discomfort  Wear a good support bra if your breasts are tender.  Take warm water baths (sitz baths) to soothe pain or discomfort caused by hemorrhoids. Use hemorrhoid cream if your doctor approves.  Rest with your legs raised if you have leg cramps or low back pain.  If you develop puffy, bulging veins (varicose veins) in your legs: ? Wear support hose or compression stockings as told by your doctor. ? Raise (elevate) your feet for 15 minutes, 3-4 times a day. ? Limit salt in your food. Prenatal care  Write down your questions. Take them to your prenatal visits.  Keep all your prenatal visits as told by your doctor. This is important. Safety  Wear your seat belt when driving.  Make a list of emergency phone numbers, including numbers for family, friends, the hospital, and police and fire departments. General instructions  Ask your doctor about the right foods to eat or for help finding a counselor, if you need these services.  Ask your doctor about local prenatal classes. Begin classes before month 6 of your pregnancy.  Do not use hot tubs, steam rooms, or saunas.  Do not douche or use tampons or scented sanitary pads.  Do not cross your legs for long periods of time.  Visit your dentist if you have not done so. Use a soft toothbrush to brush your teeth. Floss gently.  Avoid all smoking, herbs,   and alcohol. Avoid drugs that are not approved by your doctor.  Do not use any products that contain nicotine or tobacco, such as cigarettes and e-cigarettes. If you need help quitting, ask your doctor.  Avoid cat litter boxes and soil used by cats. These carry germs that can cause birth defects in the baby and can cause a loss of your baby (miscarriage) or stillbirth. Contact a doctor if:  You have mild cramps or pressure in your lower belly.  You have pain when you pee (urinate).  You have bad smelling fluid coming from your vagina.  You continue to  feel sick to your stomach (nauseous), throw up (vomit), or have watery poop (diarrhea).  You have a nagging pain in your belly area.  You feel dizzy. Get help right away if:  You have a fever.  You are leaking fluid from your vagina.  You have spotting or bleeding from your vagina.  You have severe belly cramping or pain.  You lose or gain weight rapidly.  You have trouble catching your breath and have chest pain.  You notice sudden or extreme puffiness (swelling) of your face, hands, ankles, feet, or legs.  You have not felt the baby move in over an hour.  You have severe headaches that do not go away when you take medicine.  You have trouble seeing. Summary  The second trimester is from week 14 through week 27 (months 4 through 6). This is often the time in pregnancy that you feel your best.  To take care of yourself and your unborn baby, you will need to eat healthy meals, take medicines only if your doctor tells you to do so, and do activities that are safe for you and your baby.  Call your doctor if you get sick or if you notice anything unusual about your pregnancy. Also, call your doctor if you need help with the right food to eat, or if you want to know what activities are safe for you. This information is not intended to replace advice given to you by your health care provider. Make sure you discuss any questions you have with your health care provider. Document Released: 03/16/2009 Document Revised: 04/13/2018 Document Reviewed: 01/26/2016 Elsevier Patient Education  [employer].  Back Pain in Pregnancy Back pain during pregnancy is common. Back pain may be caused by several factors that are related to changes during your pregnancy. Follow these instructions at home: Managing pain, stiffness, and swelling     If directed, for sudden (acute) back pain, put ice on the painful area. Put ice in a plastic bag. Place a towel between your skin and the  bag. Leave the ice on for 20 minutes, 2-3 times per day. If directed, apply heat to the affected area before you exercise. Use the heat source that your health care provider recommends, such as a moist heat pack or a heating pad. Place a towel between your skin and the heat source. Leave the heat on for 20-30 minutes. Remove the heat if your skin turns bright red. This is especially important if you are unable to feel pain, heat, or cold. You may have a greater risk of getting burned. If directed, massage the affected area. Activity Exercise as told by your health care provider. Gentle exercise is the best way to prevent or manage back pain. Listen to your body when lifting. If lifting hurts, ask for help or bend your knees. This uses your leg muscles instead of your  back muscles. Squat down when picking up something from the floor. Do not bend over. Only use bed rest for short periods as told by your health care provider. Bed rest should only be used for the most severe episodes of back pain. Standing, sitting, and lying down Do not stand in one place for long periods of time. Use good posture when sitting. Make sure your head rests over your shoulders and is not hanging forward. Use a pillow on your lower back if necessary. Try sleeping on your side, preferably the left side, with a pregnancy support pillow or 1-2 regular pillows between your legs. If you have back pain after a night's rest, your bed may be too soft. A firm mattress may provide more support for your back during pregnancy. General instructions Do not wear high heels. Eat a healthy diet. Try to gain weight within your health care provider's recommendations. Use a maternity girdle, elastic sling, or back brace as told by your health care provider. Take over-the-counter and prescription medicines only as told by your health care provider. Work with a physical therapist or massage therapist to find ways to manage back pain.  Acupuncture or massage therapy may be helpful. Keep all follow-up visits as told by your health care provider. This is important. Contact a health care provider if: Your back pain interferes with your daily activities. You have increasing pain in other parts of your body. Get help right away if: You develop numbness, tingling, weakness, or problems with the use of your arms or legs. You develop severe back pain that is not controlled with medicine. You have a change in bowel or bladder control. You develop shortness of breath, dizziness, or you faint. You develop nausea, vomiting, or sweating. You have back pain that is a rhythmic, cramping pain similar to labor pains. Labor pain is usually 1-2 minutes apart, lasts for about 1 minute, and involves a bearing down feeling or pressure in your pelvis. You have back pain and your water breaks or you have vaginal bleeding. You have back pain or numbness that travels down your leg. Your back pain developed after you fell. You develop pain on one side of your back. You see blood in your urine. You develop skin blisters in the area of your back pain. Summary Back pain may be caused by several factors that are related to changes during your pregnancy. Follow instructions as told by your health care provider for managing pain, stiffness, and swelling. Exercise as told by your health care provider. Gentle exercise is the best way to prevent or manage back pain. Take over-the-counter and prescription medicines only as told by your health care provider. Keep all follow-up visits as told by your health care provider. This is important. This information is not intended to replace advice given to you by your health care provider. Make sure you discuss any questions you have with your health care provider. Document Released: 03/30/2005 Document Revised: 04/10/2018 Document Reviewed: 06/07/2017 Elsevier Patient Education  2020 [employer].

## 2018-10-02 NOTE — Progress Notes (Signed)
I connected with [name]  on 10/02/18 at  4:15 PM EDT by: MyChart and verified that I am speaking with the correct person using two identifiers.  Patient is located at home and provider is located at [facility].     The purpose of this virtual visit is to provide medical care while limiting exposure to the novel coronavirus. I discussed the limitations, risks, security and privacy concerns of performing an evaluation and management service by MyChart and the availability of in person appointments. I also discussed with the patient that there may be a patient responsible charge related to this service. By engaging in this virtual visit, you consent to the provision of healthcare.  Additionally, you authorize for your insurance to be billed for the services provided during this visit.  The patient expressed understanding and agreed to proceed.  The following staff members participated in the virtual visit:  Lowanda Foster, CMA    PRENATAL VISIT NOTE  Subjective:  Elaine Stewart is a 23 y.o. G3P1011 at [redacted]w[redacted]d  for phone visit for ongoing prenatal care.  She is currently monitored for the following issues for this low-risk pregnancy and has Hypertrophy of tonsils and adenoids; Polyarticular juvenile rheumatoid arthritis, chronic ([place]); Rhinitis, allergic; Anemia; and Supervision of low-risk pregnancy on their problem list.  Patient reports backache.  Contractions: Not present. Vag. Bleeding: None.  Movement: Present. Denies leaking of fluid.   The following portions of the patient's history were reviewed and updated as appropriate: allergies, current medications, past family history, past medical history, past social history, past surgical history and problem list.   Objective:  There were no vitals filed for this visit. Self-Obtained  Fetal Status:     Movement: Present     Assessment and Plan:  Pregnancy: G3P1011 at [redacted]w[redacted]d 1. Encounter for supervision of low-risk pregnancy, antepartum - Doing  well - Had issues with pharmacy and BP cuff, but will pick up tomorrow   2. Back pain affecting pregnancy in second trimester - Tylenol PRN for pain advised - Discussed abdominal binder  - Discussed heat and hydrotherapy   Preterm labor symptoms and general obstetric precautions including but not limited to vaginal bleeding, contractions, leaking of fluid and fetal movement were reviewed in detail with the patient.  Return in about 6 weeks (around 11/13/2018) for LOB, In-Person, 28 week labs (fasting).  Future Appointments  Date Time Provider [place]  10/30/2018  4:15 PM Danielle Rankin [facility]     Time spent on virtual visit: 10 minutes  Elaine Stewart, [place]

## 2018-10-23 DIAGNOSIS — H5213 Myopia, bilateral: Secondary | ICD-10-CM | POA: Diagnosis not present

## 2018-10-30 ENCOUNTER — Other Ambulatory Visit: Payer: Self-pay

## 2018-10-30 ENCOUNTER — Encounter: Payer: 59 | Admitting: Medical

## 2018-10-30 ENCOUNTER — Inpatient Hospital Stay (HOSPITAL_COMMUNITY)
Admission: AD | Admit: 2018-10-30 | Discharge: 2018-10-30 | Disposition: A | Discharge status: Home / Self Care | Payer: 59 | Attending: Obstetrics and Gynecology | Admitting: Obstetrics and Gynecology

## 2018-10-30 ENCOUNTER — Encounter (HOSPITAL_COMMUNITY): Payer: Self-pay | Admitting: *Deleted

## 2018-10-30 DIAGNOSIS — M1990 Unspecified osteoarthritis, unspecified site: Secondary | ICD-10-CM | POA: Diagnosis not present

## 2018-10-30 DIAGNOSIS — Z79899 Other long term (current) drug therapy: Secondary | ICD-10-CM | POA: Diagnosis not present

## 2018-10-30 DIAGNOSIS — R109 Unspecified abdominal pain: Secondary | ICD-10-CM | POA: Diagnosis not present

## 2018-10-30 DIAGNOSIS — J45909 Unspecified asthma, uncomplicated: Secondary | ICD-10-CM | POA: Diagnosis not present

## 2018-10-30 DIAGNOSIS — O99512 Diseases of the respiratory system complicating pregnancy, second trimester: Secondary | ICD-10-CM | POA: Diagnosis not present

## 2018-10-30 DIAGNOSIS — O26892 Other specified pregnancy related conditions, second trimester: Secondary | ICD-10-CM | POA: Diagnosis present

## 2018-10-30 DIAGNOSIS — R1030 Lower abdominal pain, unspecified: Secondary | ICD-10-CM | POA: Diagnosis not present

## 2018-10-30 DIAGNOSIS — Z3A26 26 weeks gestation of pregnancy: Secondary | ICD-10-CM | POA: Diagnosis not present

## 2018-10-30 DIAGNOSIS — R319 Hematuria, unspecified: Secondary | ICD-10-CM | POA: Insufficient documentation

## 2018-10-30 DIAGNOSIS — Z3491 Encounter for supervision of normal pregnancy, unspecified, first trimester: Secondary | ICD-10-CM

## 2018-10-30 LAB — URINALYSIS, ROUTINE W REFLEX MICROSCOPIC
Bilirubin Urine: NEGATIVE
Glucose, UA: NEGATIVE mg/dL
Hgb urine dipstick: NEGATIVE
Ketones, ur: 20 mg/dL — AB
Nitrite: NEGATIVE
Protein, ur: 30 mg/dL — AB
Specific Gravity, Urine: 1.03 (ref 1.005–1.030)
pH: 5 (ref 5.0–8.0)

## 2018-10-30 MED ORDER — PRENATAL PLUS 27-1 MG PO TABS: 1.0000 | ORAL_TABLET | Freq: Every day | ORAL | 11 refills | Status: DC

## 2018-10-30 NOTE — Discharge Instructions (Signed)
Abdominal Pain During Pregnancy ° °Abdominal pain is common during pregnancy, and has many possible causes. Some causes are more serious than others, and sometimes the cause is not known. Abdominal pain can be a sign that labor is starting. It can also be caused by normal growth and stretching of muscles and ligaments during pregnancy. Always tell your health care provider if you have any abdominal pain. °Follow these instructions at home: °· Do not have sex or put anything in your vagina until your pain goes away completely. °· Get plenty of rest until your pain improves. °· Drink enough fluid to keep your urine pale yellow. °· Take over-the-counter and prescription medicines only as told by your health care provider. °· Keep all follow-up visits as told by your health care provider. This is important. °Contact a health care provider if: °· Your pain continues or gets worse after resting. °· You have lower abdominal pain that: °? Comes and goes at regular intervals. °? Spreads to your back. °? Is similar to menstrual cramps. °· You have pain or burning when you urinate. °Get help right away if: °· You have a fever or chills. °· You have vaginal bleeding. °· You are leaking fluid from your vagina. °· You are passing tissue from your vagina. °· You have vomiting or diarrhea that lasts for more than 24 hours. °· Your baby is moving less than usual. °· You feel very weak or faint. °· You have shortness of breath. °· You develop severe pain in your upper abdomen. °Summary °· Abdominal pain is common during pregnancy, and has many possible causes. °· If you experience abdominal pain during pregnancy, tell your health care provider right away. °· Follow your health care provider's home care instructions and keep all follow-up visits as directed. °This information is not intended to replace advice given to you by your health care provider. Make sure you discuss any questions you have with your health care  provider. °Document Released: 12/20/2004 Document Revised: 04/09/2018 Document Reviewed: 03/24/2016 °Elsevier Patient Education © 2020 Elsevier Inc. ° °

## 2018-10-30 NOTE — MAU Provider Note (Signed)
Chief Complaint:  Abdominal Pain and Hematuria   First Provider Initiated Contact with Patient 10/30/18 1119     HPI: Elaine Stewart is a 23 y.o. G3P1011 at 14w0dwho presents to maternity admissions reporting abdominal pain. Symptoms started last night. Reports constant sharp/pressure pain in her suprapubic area. Nothing makes symptoms better or worse. Doesn't feel like contractions. Also noticed blood in her urine. States she is sure the it was her urine that was bloody & that the source wasn't her vagina or rectum.  Denies n/v, fever/chills, dysuria, flank pain, LOF, or vaginal bleeding. Good fetal movement.   Location: abdomen, suprapubic Quality: sharp, pressure Severity: 6/10 in pain scale Duration: <1 day Timing: constant Modifying factors: none Associated signs and symptoms: hematuria  Pregnancy Course: goes to [facility] Denies complications with this pregnancy.   Past Medical History:  Diagnosis Date  . Acne 09/27/2010  . Arthritis   . Asthma affecting pregnancy, antepartum 05/07/2014  . BV (bacterial vaginosis) 01/13/2014  . GBS (group B streptococcus) UTI complicating pregnancy 12/80/0349  OB History  Gravida Para Term Preterm AB Living  '3 1 1 ' 0 1 1  SAB TAB Ectopic Multiple Live Births  1 0 0 0 1    # Outcome Date GA Lbr Len/2nd Weight Sex Delivery Anes PTL Lv  3 Current           2 Term 07/18/14 [id number] g M Vag-Spont None N LIV  1 SAB            Past Surgical History:  Procedure Laterality Date  . NO PAST SURGERIES     Family History  Problem Relation Age of Onset  . Healthy Mother   . Asthma Mother   . Healthy Father   . Diabetes Maternal Grandmother   . Diabetes Maternal Grandfather   . Diabetes Paternal Grandmother   . Diabetes Paternal Grandfather    Social History   Tobacco Use  . Smoking status: Never Smoker  . Smokeless tobacco: Never Used  Substance Use Topics  . Alcohol use: No  . Drug use: No   Allergies  Allergen Reactions  .  Penicillins Hives    Has patient had a PCN reaction causing immediate rash, facial/tongue/throat swelling, SOB or lightheadedness with hypotension: Yes Has patient had a PCN reaction causing severe rash involving mucus membranes or skin necrosis: No Has patient had a PCN reaction that required hospitalization: No Has patient had a PCN reaction occurring within the last 10 years: Yes If all of the above answers are "NO", then may proceed with Cephalosporin use.    Medications Prior to Admission  Medication Sig Dispense Refill Last Dose  . [DISCONTINUED] prenatal vitamin w/FE, FA (PRENATAL 1 + 1) 27-1 MG TABS tablet Take 1 tablet by mouth daily at 12 noon. 30 tablet 6 10/29/2018 at 1600  . Blood Pressure Monitoring (BLOOD PRESSURE KIT) DEVI 1 Device by Does not apply route as needed. ICD 10:  Z34.90 1 Device 0   . docusate sodium (COLACE) 100 MG capsule Take 1 capsule (100 mg total) by mouth 2 (two) times daily. 60 capsule 0   . ondansetron (ZOFRAN ODT) 4 MG disintegrating tablet Take 1 tablet (4 mg total) by mouth every 8 (eight) hours as needed for nausea or vomiting. (Patient not taking: Reported on 10/02/2018) 20 tablet 1   . promethazine (PHENERGAN) 25 MG tablet Take 1 tablet (25 mg total) by mouth every 6 (six) hours as needed for nausea or vomiting.  30 tablet 1   . terconazole (TERAZOL 3) 0.8 % vaginal cream Place 1 applicator vaginally at bedtime. (Patient not taking: Reported on 10/02/2018) 20 g 0     I have reviewed patient's Past Medical Hx, Surgical Hx, Family Hx, Social Hx, medications and allergies.   ROS:  Review of Systems  Constitutional: Negative.   Gastrointestinal: Positive for abdominal pain. Negative for constipation, diarrhea, nausea and vomiting.  Genitourinary: Positive for hematuria. Negative for dysuria, flank pain, frequency, urgency, vaginal bleeding and vaginal discharge.    Physical Exam   Patient Vitals for the past 24 hrs:  BP Temp Temp src Pulse Resp SpO2   10/30/18 1213 112/65 97.7 F (36.5 C) Oral 81 18 100 %  10/30/18 1055 100/63 98.7 F (37.1 C) Oral 85 20 100 %    Constitutional: Well-developed, well-nourished female in no acute distress.  Cardiovascular: normal rate & rhythm, no murmur Respiratory: normal effort, lung sounds clear throughout GI: Abd soft, non-tender, gravid appropriate for gestational age. Pos BS x 4 MS: Extremities nontender, no edema, normal ROM Neurologic: Alert and oriented x 4.  GU:  No blood. NEFG  Dilation: Closed Effacement (%): Thick Exam by:: Robyne Askew, NP  NST:  Baseline: 150 bpm, Variability: Good {> 6 bpm), Accelerations: Non-reactive but appropriate for gestational age, Decelerations: Absent and some UI initially, no further ctx   Labs: Results for orders placed or performed during the hospital encounter of 10/30/18 (from the past 24 hour(s))  Urinalysis, Routine w reflex microscopic     Status: Abnormal   Collection Time: 10/30/18 11:09 AM  Result Value Ref Range   Color, Urine AMBER (A) YELLOW   APPearance HAZY (A) CLEAR   Specific Gravity, Urine 1.030 1.005 - 1.030   pH 5.0 5.0 - 8.0   Glucose, UA NEGATIVE NEGATIVE mg/dL   Hgb urine dipstick NEGATIVE NEGATIVE   Bilirubin Urine NEGATIVE NEGATIVE   Ketones, ur 20 (A) NEGATIVE mg/dL   Protein, ur 30 (A) NEGATIVE mg/dL   Nitrite NEGATIVE NEGATIVE   Leukocytes,Ua TRACE (A) NEGATIVE   RBC / HPF 0-5 0 - 5 RBC/hpf   WBC, UA 6-10 0 - 5 WBC/hpf   Bacteria, UA MANY (A) NONE SEEN   Squamous Epithelial / LPF 11-20 0 - 5   Mucus PRESENT     Imaging:  No results found.  MAU Course: Orders Placed This Encounter  Procedures  . Culture, OB Urine  . Urinalysis, Routine w reflex microscopic  . Discharge patient   Meds ordered this encounter  Medications  . prenatal vitamin w/FE, FA (PRENATAL 1 + 1) 27-1 MG TABS tablet    Sig: Take 1 tablet by mouth daily at 12 noon.    Dispense:  30 tablet    Refill:  11    Order Specific Question:    Supervising Provider    Answer:   CONSTANT, PEGGY [[id number]]    MDM: Category 1 tracing. Initially some UI on TOCO, flattened out. Abdomen soft & no ctx palpated.  Cervix closed/thick No blood  U/a with some leuks & bacteria, No hematuria. Will send for culture.    Assessment: 1. Abdominal pain during pregnancy in second trimester   2. [redacted] weeks gestation of pregnancy   3. Encounter for supervision of low-risk pregnancy in first trimester     Plan: Discharge home in stable condition.  Preterm Labor precautions and fetal kick counts Increase water intake Urine culture pending Refills sent for prenatal vitamins  Follow-up Information  [facility] Follow up.   Specialty: Obstetrics and Gynecology Why: return for worsening symptoms Contact information: [address] [id number] [place] [phone]          Allergies as of 10/30/2018      Reactions   Penicillins Hives   Has patient had a PCN reaction causing immediate rash, facial/tongue/throat swelling, SOB or lightheadedness with hypotension: Yes Has patient had a PCN reaction causing severe rash involving mucus membranes or skin necrosis: No Has patient had a PCN reaction that required hospitalization: No Has patient had a PCN reaction occurring within the last 10 years: Yes If all of the above answers are "NO", then may proceed with Cephalosporin use.      Medication List    STOP taking these medications   docusate sodium 100 MG capsule Commonly known as: COLACE   ondansetron 4 MG disintegrating tablet Commonly known as: Zofran ODT   promethazine 25 MG tablet Commonly known as: PHENERGAN   terconazole 0.8 % vaginal cream Commonly known as: Terazol 3     TAKE these medications   Blood Pressure Kit Devi 1 Device by Does not apply route as needed. ICD 10:  Z34.90   prenatal vitamin w/FE, FA 27-1 MG Tabs tablet Take 1 tablet by mouth daily at 12  noon.       Jorje Guild, NP 10/30/2018 12:19 PM

## 2018-10-30 NOTE — MAU Note (Signed)
Presents with c/o sharp lower abdominal pain that began last night and hematuria this morning.  Reports pain is constant, "but doesn't feel like a ctxs.  Denies LOF or VB.  Reportrs +FM.

## 2018-10-30 NOTE — Progress Notes (Signed)
Patient did not answer.  

## 2018-10-30 NOTE — Patient Instructions (Signed)
Please reschedule your appointment as soon as possible 

## 2018-10-31 LAB — CULTURE, OB URINE: Culture: NO GROWTH

## 2018-11-05 ENCOUNTER — Encounter: Payer: Self-pay | Admitting: Student

## 2018-11-05 ENCOUNTER — Telehealth (INDEPENDENT_AMBULATORY_CARE_PROVIDER_SITE_OTHER): Payer: 59 | Admitting: Student

## 2018-11-05 ENCOUNTER — Other Ambulatory Visit: Payer: Self-pay

## 2018-11-05 DIAGNOSIS — Z3A26 26 weeks gestation of pregnancy: Secondary | ICD-10-CM

## 2018-11-05 DIAGNOSIS — Z3493 Encounter for supervision of normal pregnancy, unspecified, third trimester: Secondary | ICD-10-CM

## 2018-11-05 MED ORDER — COMFORT FIT MATERNITY SUPP SM MISC: 1.0000 | 0 refills | Status: DC | PRN

## 2018-11-05 NOTE — Progress Notes (Signed)
   TELEHEALTH VIRTUAL OBSTETRICS VISIT ENCOUNTER NOTE  I connected with Elaine Stewart on 11/05/18 at 11:15 AM EST by telephone at home and verified that I am speaking with the correct person using two identifiers.   I discussed the limitations, risks, security and privacy concerns of performing an evaluation and management service by telephone and the availability of in person appointments. I also discussed with the patient that there may be a patient responsible charge related to this service. The patient expressed understanding and agreed to proceed.  Subjective:  Elaine Stewart is a 23 y.o. G3P1011 at [redacted]w[redacted]d being followed for ongoing prenatal care.  She is currently monitored for the following issues for this low-risk pregnancy and has Hypertrophy of tonsils and adenoids; Polyarticular juvenile rheumatoid arthritis, chronic ([place]); Rhinitis, allergic; Anemia; and Supervision of low-risk pregnancy on their problem list.  Patient reports vaginal pressure and low back pain. Reports fetal movement. Denies any contractions, bleeding or leaking of fluid.   The following portions of the patient's history were reviewed and updated as appropriate: allergies, current medications, past family history, past medical history, past social history, past surgical history and problem list.   Objective:   General:  Alert, oriented and cooperative.   Mental Status: Normal mood and affect perceived. Normal judgment and thought content.  Rest of physical exam deferred due to type of encounter  Assessment and Plan:  Pregnancy: G3P1011 at [redacted]w[redacted]d 1. Encounter for supervision of low-risk pregnancy in third trimester -will bring back in the next week for 28 wk labs -discussed use of maternity support belt. Rx printed out & left at front desk. 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  I discussed the assessment and treatment plan with the patient. The patient was provided an opportunity to ask questions and all were answered. The patient agreed with the plan and demonstrated an understanding of the instructions. The patient was advised to call back or seek an in-person office evaluation/go to MAU at [facility] for any urgent or concerning symptoms. Please refer to After Visit Summary for other counseling recommendations.   I provided 7 minutes of non-face-to-face time during this encounter.  No follow-ups on file.  Future Appointments  Date Time Provider [place]  11/19/2018  8:35 AM Ephraim Hamburger, Rona Ravens, NP [facility]OC  11/19/2018  9:30 AM [facility] LAB [place], NP [facility], [place]

## 2018-11-05 NOTE — Progress Notes (Signed)
I connected with  [name] on 11/05/18 at 11:15 AM EST by telephone and verified that I am speaking with the correct person using two identifiers.   I discussed the limitations, risks, security and privacy concerns of performing an evaluation and management service by telephone and the availability of in person appointments. I also discussed with the patient that there may be a patient responsible charge related to this service. The patient expressed understanding and agreed to proceed.  [name], [place] 11/05/2018  11:17 AM   Not at home to do  Blood -pressure

## 2018-11-12 ENCOUNTER — Other Ambulatory Visit: Payer: Self-pay

## 2018-11-12 DIAGNOSIS — Z20822 Contact with and (suspected) exposure to covid-19: Secondary | ICD-10-CM

## 2018-11-15 LAB — NOVEL CORONAVIRUS, NAA: SARS-CoV-2, NAA: DETECTED — AB

## 2018-11-19 ENCOUNTER — Other Ambulatory Visit: Payer: 59

## 2018-11-19 ENCOUNTER — Encounter: Payer: Self-pay | Admitting: Nurse Practitioner

## 2018-11-19 ENCOUNTER — Other Ambulatory Visit: Payer: Self-pay

## 2018-11-19 ENCOUNTER — Telehealth (INDEPENDENT_AMBULATORY_CARE_PROVIDER_SITE_OTHER): Payer: 59 | Admitting: Nurse Practitioner

## 2018-11-19 DIAGNOSIS — O98513 Other viral diseases complicating pregnancy, third trimester: Secondary | ICD-10-CM

## 2018-11-19 DIAGNOSIS — U071 COVID-19: Secondary | ICD-10-CM

## 2018-11-19 DIAGNOSIS — Z8616 Personal history of COVID-19: Secondary | ICD-10-CM | POA: Insufficient documentation

## 2018-11-19 DIAGNOSIS — Z3490 Encounter for supervision of normal pregnancy, unspecified, unspecified trimester: Secondary | ICD-10-CM

## 2018-11-19 DIAGNOSIS — Z3A28 28 weeks gestation of pregnancy: Secondary | ICD-10-CM

## 2018-11-19 NOTE — Progress Notes (Signed)
I connected with  Elaine Stewart on 11/19/18 at  8:35 AM EST by telephone and verified that I am speaking with the correct person using two identifiers.   I discussed the limitations, risks, security and privacy concerns of performing an evaluation and management service by telephone and the availability of in person appointments. I also discussed with the patient that there may be a patient responsible charge related to this service. The patient expressed understanding and agreed to proceed.  Bethanne Ginger, [place] 11/19/2018  8:38 AM

## 2018-11-19 NOTE — Progress Notes (Signed)
I connected with@ on 11/19/18 at  8:35 AM EST by: MyChart and verified that I am speaking with the correct person using two identifiers.  Patient is located at home and provider is located at [facility].     The purpose of this virtual visit is to provide medical care while limiting exposure to the novel coronavirus. I discussed the limitations, risks, security and privacy concerns of performing an evaluation and management service by Earlie Server, NP and the availability of in person appointments. I also discussed with the patient that there may be a patient responsible charge related to this service. By engaging in this virtual visit, you consent to the provision of healthcare.  Additionally, you authorize for your insurance to be billed for the services provided during this visit.  The patient expressed understanding and agreed to proceed.  The following staff members participated in the virtual visit:  Lowanda Foster, CMA and Earlie Server, NP    PRENATAL VISIT NOTE  Subjective:  Elaine Stewart is a 23 y.o. G3P1011 at [redacted]w[redacted]d  for phone visit for ongoing prenatal care.  She is currently monitored for the following issues for this low-risk pregnancy and has Hypertrophy of tonsils and adenoids; Polyarticular juvenile rheumatoid arthritis, chronic ([place]); Rhinitis, allergic; Anemia; Supervision of low-risk pregnancy; and COVID-19 affecting pregnancy in third trimester on their problem list.  Patient reports no complaints.  No Fever but sometimes has difficulty breathing for short periods of time.  She has not been severe. Able to speak and converse by MyChart today.  Contractions: Not present. Vag. Bleeding: None.  Movement: Present. Denies leaking of fluid.   The following portions of the patient's history were reviewed and updated as appropriate: allergies, current medications, past family history, past medical history, past social history, past surgical history and problem list.   Objective:   There were no vitals filed for this visit. Self-Obtained  Fetal Status:     Movement: Present     Assessment and Plan:  Pregnancy: G3P1011 at [redacted]w[redacted]d Supervision of pregnancy Still needs to get BP cuff.  Message sent to client about getting cuff from [place]. Baby is moving well  Covid 19 positive Knows to go to the ER if she has persistent shortness of breath.  Has periodic shortness of breath when walking but has not felt that it is severe.  No fevers.  Staying home.  Partner has tested positive also.  Quarantine over 11-24-18.  Preterm labor symptoms and general obstetric precautions including but not limited to vaginal bleeding, contractions, leaking of fluid and fetal movement were reviewed in detail with the patient.  Return in about 1 week (around 11/26/2018) for has previously scheduled in person visit for 28 week labs.  Future Appointments  Date Time Provider [place]  11/26/2018  8:40 AM [facility] LAB [facility]OC     Time spent on virtual visit: 7 minutes  Virginia Rochester, NP

## 2018-11-22 ENCOUNTER — Other Ambulatory Visit: Payer: Self-pay | Admitting: *Deleted

## 2018-11-22 DIAGNOSIS — Z3490 Encounter for supervision of normal pregnancy, unspecified, unspecified trimester: Secondary | ICD-10-CM

## 2018-11-26 ENCOUNTER — Other Ambulatory Visit: Payer: 59

## 2018-11-27 ENCOUNTER — Other Ambulatory Visit: Payer: 59

## 2018-12-05 ENCOUNTER — Other Ambulatory Visit: Payer: Self-pay

## 2018-12-05 ENCOUNTER — Encounter: Payer: Self-pay | Admitting: Advanced Practice Midwife

## 2018-12-05 ENCOUNTER — Other Ambulatory Visit: Payer: 59

## 2018-12-05 ENCOUNTER — Ambulatory Visit (INDEPENDENT_AMBULATORY_CARE_PROVIDER_SITE_OTHER): Payer: 59 | Admitting: Advanced Practice Midwife

## 2018-12-05 VITALS — BP 111/60 | HR 93 | Wt 137.0 lb

## 2018-12-05 DIAGNOSIS — Z3490 Encounter for supervision of normal pregnancy, unspecified, unspecified trimester: Secondary | ICD-10-CM

## 2018-12-05 DIAGNOSIS — Z3403 Encounter for supervision of normal first pregnancy, third trimester: Secondary | ICD-10-CM

## 2018-12-05 DIAGNOSIS — Z3A31 31 weeks gestation of pregnancy: Secondary | ICD-10-CM

## 2018-12-05 NOTE — Progress Notes (Signed)
Decline Flu will decide on TDAP at later visit

## 2018-12-05 NOTE — Progress Notes (Signed)
   PRENATAL VISIT NOTE  Subjective:  Elaine Stewart is a 23 y.o. G3P1011 at [redacted]w[redacted]d being seen today for ongoing prenatal care.  She is currently monitored for the following issues for this low-risk pregnancy and has Hypertrophy of tonsils and adenoids; Polyarticular juvenile rheumatoid arthritis, chronic ([place]); Rhinitis, allergic; Anemia; Supervision of low-risk pregnancy; and COVID-19 affecting pregnancy in third trimester on their problem list.  Patient reports no complaints.  Contractions: Not present. Vag. Bleeding: None.  Movement: Present. Denies leaking of fluid.   The following portions of the patient's history were reviewed and updated as appropriate: allergies, current medications, past family history, past medical history, past social history, past surgical history and problem list.   Objective:   Vitals:   12/05/18 0924  BP: 111/60  Pulse: 93  Weight: 137 lb (62.1 kg)    Fetal Status: Fetal Heart Rate (bpm): 143 Fundal Height: 31 cm Movement: Present     General:  Alert, oriented and cooperative. Patient is in no acute distress.  Skin: Skin is warm and dry. No rash noted.   Cardiovascular: Normal heart rate noted  Respiratory: Normal respiratory effort, no problems with respiration noted  Abdomen: Soft, gravid, appropriate for gestational age.  Pain/Pressure: Absent     Pelvic: Cervical exam deferred        Extremities: Normal range of motion.  Edema: None  Mental Status: Normal mood and affect. Normal behavior. Normal judgment and thought content.   Assessment and Plan:  Pregnancy: G3P1011 at [redacted]w[redacted]d 1. Encounter for supervision of normal first pregnancy in third trimester - routine care - General pregnancy work note given    Preterm labor symptoms and general obstetric precautions including but not limited to vaginal bleeding, contractions, leaking of fluid and fetal movement were reviewed in detail with the patient. Please refer to After Visit Summary for other  counseling recommendations.   Return in about 2 weeks (around 12/19/2018) for virtual visit .  No future appointments.  Marcille Buffy DNP, CNM  12/05/18  10:49 AM

## 2018-12-06 LAB — CBC
Hematocrit: 31.2 % — ABNORMAL LOW (ref 34.0–46.6)
Hemoglobin: 10.4 g/dL — ABNORMAL LOW (ref 11.1–15.9)
MCH: 29.8 pg (ref 26.6–33.0)
MCHC: 33.3 g/dL (ref 31.5–35.7)
MCV: 89 fL (ref 79–97)
Platelets: 289 10*3/uL (ref 150–450)
RBC: 3.49 x10E6/uL — ABNORMAL LOW (ref 3.77–5.28)
RDW: 12.9 % (ref 11.7–15.4)
WBC: 8.3 10*3/uL (ref 3.4–10.8)

## 2018-12-06 LAB — HIV ANTIBODY (ROUTINE TESTING W REFLEX): HIV Screen 4th Generation wRfx: NONREACTIVE

## 2018-12-06 LAB — RPR: RPR Ser Ql: NONREACTIVE

## 2018-12-09 ENCOUNTER — Inpatient Hospital Stay (HOSPITAL_COMMUNITY)
Admission: AD | Admit: 2018-12-09 | Discharge: 2018-12-09 | Disposition: A | Discharge status: Home / Self Care | Payer: 59 | Attending: Obstetrics & Gynecology | Admitting: Obstetrics & Gynecology

## 2018-12-09 ENCOUNTER — Encounter (HOSPITAL_COMMUNITY): Payer: Self-pay

## 2018-12-09 ENCOUNTER — Other Ambulatory Visit: Payer: Self-pay

## 2018-12-09 DIAGNOSIS — O26893 Other specified pregnancy related conditions, third trimester: Secondary | ICD-10-CM | POA: Diagnosis not present

## 2018-12-09 DIAGNOSIS — Z3A31 31 weeks gestation of pregnancy: Secondary | ICD-10-CM

## 2018-12-09 DIAGNOSIS — O26899 Other specified pregnancy related conditions, unspecified trimester: Secondary | ICD-10-CM

## 2018-12-09 DIAGNOSIS — Z880 Allergy status to penicillin: Secondary | ICD-10-CM | POA: Diagnosis not present

## 2018-12-09 DIAGNOSIS — R102 Pelvic and perineal pain unspecified side: Secondary | ICD-10-CM

## 2018-12-09 LAB — URINALYSIS, ROUTINE W REFLEX MICROSCOPIC
Bilirubin Urine: NEGATIVE
Glucose, UA: NEGATIVE mg/dL
Hgb urine dipstick: NEGATIVE
Ketones, ur: NEGATIVE mg/dL
Leukocytes,Ua: NEGATIVE
Nitrite: NEGATIVE
Protein, ur: NEGATIVE mg/dL
Specific Gravity, Urine: 1.026 (ref 1.005–1.030)
pH: 5 (ref 5.0–8.0)

## 2018-12-09 MED ORDER — COMFORT FIT MATERNITY SUPP SM MISC: 1.0000 [IU] | Freq: Every day | 0 refills | Status: DC | PRN

## 2018-12-09 NOTE — MAU Note (Signed)
Pt presents to MAU complaining of pelvic pressure that began on 12/07/2018 patient states "it has been getting worse and makes it difficult for me to walk." +FM. No vaginal bleeding or gushes of fluid.

## 2018-12-09 NOTE — MAU Provider Note (Signed)
Chief Complaint:  Pelvic Pain   First Provider Initiated Contact with Patient 12/09/18 2124     HPI: Elaine Stewart is a 23 y.o. G3P1011 at 73w5dwho presents to maternity admissions reporting pelvic pain & pressure. Has been going on for a few weeks but worse today. Pain is worse when she is sitting down & standing. Currently is working [employer]. At 1 of her jobs she is required to stand her whole shift, making her symptoms worse. Reports feeling occasional braxton hicks but no painful contractions. Denies LOF or vaginal bleeding. Good fetal movement. States her ob talked about a maternity support belt but she doesn't recall being given a prescription; she was just told that she could go to [facility] to be fitted for one.  Location: pelvis Quality: sharp, sore Severity: 10/10 in pain scale Duration: 2 weeks Timing: constant Modifying factors: worse with standing, walking, position changes, and sitting Associated signs and symptoms: none  Past Medical History:  Diagnosis Date  . Acne 09/27/2010  . Arthritis   . Asthma affecting pregnancy, antepartum 05/07/2014  . BV (bacterial vaginosis) 01/13/2014   OB History  Gravida Para Term Preterm AB Living  '3 1 1 ' 0 1 1  SAB TAB Ectopic Multiple Live Births  1 0 0 0 1    # Outcome Date GA Lbr Len/2nd Weight Sex Delivery Anes PTL Lv  3 Current           2 Term 07/18/14 [id number] g M Vag-Spont None N LIV  1 SAB            Past Surgical History:  Procedure Laterality Date  . NO PAST SURGERIES     Family History  Problem Relation Age of Onset  . Healthy Mother   . Asthma Mother   . Healthy Father   . Diabetes Maternal Grandmother   . Diabetes Maternal Grandfather   . Diabetes Paternal Grandmother   . Diabetes Paternal Grandfather    Social History   Tobacco Use  . Smoking status: Never Smoker  . Smokeless tobacco: Never Used  Substance Use Topics  . Alcohol use: No  . Drug use: No   Allergies  Allergen Reactions  .  Penicillins Hives    Has patient had a PCN reaction causing immediate rash, facial/tongue/throat swelling, SOB or lightheadedness with hypotension: Yes Has patient had a PCN reaction causing severe rash involving mucus membranes or skin necrosis: No Has patient had a PCN reaction that required hospitalization: No Has patient had a PCN reaction occurring within the last 10 years: Yes If all of the above answers are "NO", then may proceed with Cephalosporin use.    No medications prior to admission.    I have reviewed patient's Past Medical Hx, Surgical Hx, Family Hx, Social Hx, medications and allergies.   ROS:  Review of Systems  Constitutional: Negative.   Gastrointestinal: Negative.   Genitourinary: Positive for pelvic pain. Negative for dysuria, vaginal bleeding and vaginal discharge.    Physical Exam   Patient Vitals for the past 24 hrs:  BP Temp Temp src Pulse Resp SpO2 Height Weight  12/09/18 2229 104/65 - - - - - - -  12/09/18 2102 (!) 110/57 98.2 F (36.8 C) Oral 94 18 100 % '5\' 2"'  (1.575 m) 63.4 kg    Constitutional: Well-developed, well-nourished female in no acute distress.  Cardiovascular: normal rate & rhythm, no murmur Respiratory: normal effort, lung sounds clear throughout GI: Abd soft, non-tender, gravid appropriate  for gestational age. Pos BS x 4 MS: Extremities nontender, no edema, normal ROM Neurologic: Alert and oriented x 4.  GU:  Dilation: Closed Effacement (%): Thick Cervical Position: Posterior Exam by:: Jorje Guild NP  NST:  Baseline: 155 bpm, Variability: Good {> 6 bpm), Accelerations: Reactive and Decelerations: Absent   Labs: Results for orders placed or performed during the hospital encounter of 12/09/18 (from the past 24 hour(s))  Urinalysis, Routine w reflex microscopic     Status: Abnormal   Collection Time: 12/09/18  9:27 PM  Result Value Ref Range   Color, Urine AMBER (A) YELLOW   APPearance HAZY (A) CLEAR   Specific Gravity, Urine  1.026 1.005 - 1.030   pH 5.0 5.0 - 8.0   Glucose, UA NEGATIVE NEGATIVE mg/dL   Hgb urine dipstick NEGATIVE NEGATIVE   Bilirubin Urine NEGATIVE NEGATIVE   Ketones, ur NEGATIVE NEGATIVE mg/dL   Protein, ur NEGATIVE NEGATIVE mg/dL   Nitrite NEGATIVE NEGATIVE   Leukocytes,Ua NEGATIVE NEGATIVE    Imaging:  No results found.  MAU Course: Orders Placed This Encounter  Procedures  . Urinalysis, Routine w reflex microscopic  . Discharge patient   Meds ordered this encounter  Medications  . Elastic Bandages & Supports (COMFORT FIT MATERNITY SUPP SM) MISC    Sig: 1 Units by Does not apply route daily as needed.    Dispense:  1 each    Refill:  0    Order Specific Question:   Supervising Provider    Answer:   Verita Schneiders A [[id number]]    MDM: Category 1 tracing. Few irregular ctx.  Cervix closed/thick  U/a negative for infection  S/s consistent with round ligament pain vs pubic symphysis pain. Discussed use of maternity support belt & will provide with prescription today.  Pt states only one of her 3 jobs is abiding by our pregnancy restrictions letter. States she was told she by her employer that she could only come out of work if we put her on bedrest. Discussed bedrest is not indicated for her.  Assessment: 1. Pain of round ligament affecting pregnancy, antepartum   2. Pelvic pain affecting pregnancy in third trimester, antepartum   3. [redacted] weeks gestation of pregnancy     Plan: Discharge home in stable condition.  Preterm Labor precautions and fetal kick counts Rx maternity support belt, given info for [employer]  Follow-up Information    [facility] Follow up.   Specialty: Obstetrics and Gynecology Why: return for worsening symptoms Contact information: [address] [id number] [place] [phone]       [facility] [place]. Follow up.   Why: go for maternity support belt Contact  information: [address] [place] [address] [phone]           Allergies as of 12/09/2018      Reactions   Penicillins Hives   Has patient had a PCN reaction causing immediate rash, facial/tongue/throat swelling, SOB or lightheadedness with hypotension: Yes Has patient had a PCN reaction causing severe rash involving mucus membranes or skin necrosis: No Has patient had a PCN reaction that required hospitalization: No Has patient had a PCN reaction occurring within the last 10 years: Yes If all of the above answers are "NO", then may proceed with Cephalosporin use.      Medication List    TAKE these medications   Blood Pressure Kit Devi 1 Device by Does not apply route as needed.  ICD 10:  Z34.90   [place] 1 Units by Does not apply route daily as needed. What changed:   how much to take  when to take this   prenatal vitamin w/FE, FA 27-1 MG Tabs tablet Take 1 tablet by mouth daily at 12 noon.       Jorje Guild, NP 12/09/2018 11:45 PM

## 2018-12-09 NOTE — Discharge Instructions (Signed)
PREGNANCY SUPPORT BELT: You are not alone, Seventy-five percent of women have some sort of abdominal or back pain at some point in their pregnancy. Your baby is growing at a fast pace, which means that your whole body is rapidly trying to adjust to the changes. As your uterus grows, your back may start feeling a bit under stress and this can result in back or abdominal pain that can go from mild, and therefore bearable, to severe pains that will not allow you to sit or lay down comfortably, When it comes to dealing with pregnancy-related pains and cramps, some pregnant women usually prefer natural remedies, which the market is filled with nowadays. For example, wearing a pregnancy support belt can help ease and lessen your discomfort and pain. WHAT ARE THE BENEFITS OF WEARING A PREGNANCY SUPPORT BELT? A pregnancy support belt provides support to the lower portion of the belly taking some of the weight of the growing uterus and distributing to the other parts of your body. It is designed make you comfortable and gives you extra support. Over the years, the pregnancy apparel market has been studying the needs and wants of pregnant women and they have come up with the most comfortable pregnancy support belts that woman could ever ask for. In fact, you will no longer have to wear a stretched-out or bulky pregnancy belt that is visible underneath your clothes and makes you feel even more uncomfortable. Nowadays, a pregnancy support belt is made of comfortable and stretchy materials that will not irritate your skin but will actually make you feel at ease and you will not even notice you are wearing it. They are easy to put on and adjust during the day and can be worn at night for additional support.  BENEFITS:  Relives Back pain  Relieves Abdominal Muscle and Leg Pain  Stabilizes the Pelvic Ring  Offers a Cushioned Abdominal Lift Pad  Relieves pressure on the Sciatic Nerve Within Minutes WHERE TO GET  YOUR PREGNANCY BELT: [employer] [phone]  [address] [place], [place] [id number]    Pelvic pain in pregnancy/Pubic Symphysis Pain  Some women develop pelvic pain in pregnancy. This is sometimes called pregnancy-related pelvic girdle pain (PGP) or symphysis pubis dysfunction (SPD). PGP is a collection of uncomfortable symptoms caused by a stiffness of your pelvic joints or the joints moving unevenly at either the back or front of your pelvis.  Symptoms of PGP PGP is not harmful to your baby, but it can be painful and make it hard to get around.  Women with PGP may feel pain:   over the pubic bone at the front in the center, roughly level with your hips  across 1 or both sides of your lower back   in the area between your vagina and anus (perineum)   spreading to your thighs Some women feel or hear a clicking or grinding in the pelvic area.  The pain can be worse when you're:   walking   going up or down stairs  standing on 1 leg (for example, when you're getting dressed)   turning over in bed   moving your legs apart (for example, when you get out of a car) Most women with PGP can have a vaginal birth.  Non-urgent advice: Call your midwife or doctor if you have pelvic pain and:  it's hard for you to move around  it hurts to get out of a car or turn over in bed  it's painful going up or down stairs These can be signs of pregnancy-related pelvic girdle pain.  Treatments for PGP Getting diagnosed as early as possible can help keep pain to a minimum and avoid long-term discomfort.  You can ask your midwife for a referral to a physiotherapist who specializes in obstetric pelvic joint problems.  Physiotherapy aims to relieve or ease pain, improve muscle function, and improve your pelvic joint position and stability.  This may include:   manual therapy to make sure the joints of your pelvis, hip and spine move normally   exercises to strengthen  your pelvic floor, stomach, back and hip muscles   exercises in water   advice and suggestions, including positions for labor and birth, looking after your baby and positions for sex   pain relief, such as TENS  equipment, if necessary, such as crutches or pelvic support belts These problems tend not to get completely better until the baby is born, but treatment from an experienced practitioner can improve the symptoms during pregnancy.  You can contact the Pelvic Partnership (NaturalStorm.nl) for information and support.  Coping with pelvic pain in pregnancy Your physiotherapist may recommend a pelvic support belt to help ease your pain, or crutches to help you get around.  It can help to plan your day so you avoid activities that cause you pain. For example, do not go up or down stairs more often than you have to.  The Pelvic, Obstetric & Gynaecological Physiotherapy (POGP) network also offers this advice:   be as active as possible within your pain limits, and avoid activities that make the pain worse   rest when you can   get help with household chores from your partner, family and friends   wear flat, supportive shoes   sit down to get dressed - for example, do not stand on 1 leg when putting on jeans   keep your knees together when getting in and out of the car - a plastic bag on the seat can help you swivel   sleep in a comfortable position - for example, on your side with a pillow between your legs   try different ways of turning over in bed - for example, turning over with your knees together and squeezing your buttocks   take the stairs 1 at a time, or go upstairs backwards or on your bottom   if you're using crutches, have a small backpack to carry things in   if you want to have sex, consider different positions, such as kneeling on all fours  POGP suggests that you avoid:   standing on 1 leg   bending and twisting to lift, or carrying a baby on 1  hip   crossing your legs   sitting on the floor, or sitting twisted   sitting or standing for long periods   lifting heavy weights, such as shopping bags, wet washing or a toddler   vacuuming   pushing heavy objects, such as a supermarket trolley   carrying anything in only 1 hand (try using a small backpack)  The physiotherapist should be able to provide advice on coping with the emotional impact of living with chronic pain, such as using relaxation techniques. If your pain is causing you considerable distress, then you should let your doctor or midwife know. You may require additional treatment. Download the POGP leaflet Pregnancy-related pelvic girdle pain for mothers-to-be and new mothers (http://www.frank.com/). You can get more information on managing everyday activities with PGP  from the Pelvic Partnership (ExceptionalEmployees.gl)  Labor and birth with pelvic pain Many women with pelvic pain in pregnancy can have a normal vaginal birth.  Plan ahead and talk about your birth plan with your birth partner and midwife.  Write in your birth plan that you have PGP, so the people supporting you during labor and birth will be aware of your condition.  Think about birth positions that are the most comfortable for you, and write them in your birth plan.  Being in water can take the weight off your joints and allow you to move more easily, so you might want to think about having a water birth. You can discuss this with your midwife.   Your 'pain-free range of movement' If you have pain when you open your legs, find out your pain-free range of movement.  To do this, lie on your back or sit on the edge of a chair and open your legs as far as you can without pain. Your partner or midwife can measure the distance between your knees with a tape measure. This is your pain-free range.  To protect your joints, try not to open your legs wider than this during labor and birth.    This is particularly important if you have an epidural for pain relief in labor, as you won't be feeling the pain that warns you that you're separating your legs too far.  If you have an epidural, make sure your midwife and birth partner are aware of your pain-free range of movement of your legs. When pushing in the second stage of labor, you may find it beneficial to lie on one side.  This prevents your legs from being separated too much. You can stay in this position for the birth of your baby, if you wish. Sometimes it might be necessary to open your legs wider than your pain-free range to deliver your baby safely, particularly if you have an assisted delivery (for example, with the vacuum or foreps). Even in this case, it's possible to limit the separation of your legs. Make sure your midwife and doctor are aware that you have PGP.  If you go beyond your pain-free range, your physiotherapist should assess you after the birth.  Take extra care until they have assessed and advised you.  Who gets pelvic pain in pregnancy? It's estimated that PGP affects up to 1 in 5 pregnant women to some degree.  It's not known exactly why pelvic pain affects some women, but it's thought to be linked to a number of issues, including previous damage to the pelvis, pelvic joints moving unevenly, and the weight or position of the baby.  Factors that may make a woman more likely to develop PGP include:   a history of lower back or pelvic girdle pain   previous injury to the pelvis (for example, from a fall or accident)  having PGP in a previous pregnancy   a physically demanding job  being overweight  having a multiple birth pregnancy  healthtalk.org has interviews with women talking about their experiences of pelvic pain in pregnancy. Read more about coping with common pregnancy problems (www.nhs.uk/conditions/pregnancy-and-baby) including nausea, heartburn, tiredness and constipation.  Talk with your  doctor or midwife for more information.       Fetal Movement Counts Patient Name: ________________________________________________ Patient Due Date: ____________________ What is a fetal movement count?  A fetal movement count is the number of times that you feel your baby move during a certain amount of  time. This may also be called a fetal kick count. A fetal movement count is recommended for every pregnant woman. You may be asked to start counting fetal movements as early as week 28 of your pregnancy. Pay attention to when your baby is most active. You may notice your baby's sleep and wake cycles. You may also notice things that make your baby move more. You should do a fetal movement count:  When your baby is normally most active.  At the same time each day. A good time to count movements is while you are resting, after having something to eat and drink. How do I count fetal movements? 1. Find a quiet, comfortable area. Sit, or lie down on your side. 2. Write down the date, the start time and stop time, and the number of movements that you felt between those two times. Take this information with you to your health care visits. 3. For 2 hours, count kicks, flutters, swishes, rolls, and jabs. You should feel at least 10 movements during 2 hours. 4. You may stop counting after you have felt 10 movements. 5. If you do not feel 10 movements in 2 hours, have something to eat and drink. Then, keep resting and counting for 1 hour. If you feel at least 4 movements during that hour, you may stop counting. Contact a health care provider if:  You feel fewer than 4 movements in 2 hours.  Your baby is not moving like he or she usually does. Date: ____________ Start time: ____________ Stop time: ____________ Movements: ____________ Date: ____________ Start time: ____________ Stop time: ____________ Movements: ____________ Date: ____________ Start time: ____________ Stop time: ____________  Movements: ____________ Date: ____________ Start time: ____________ Stop time: ____________ Movements: ____________ Date: ____________ Start time: ____________ Stop time: ____________ Movements: ____________ Date: ____________ Start time: ____________ Stop time: ____________ Movements: ____________ Date: ____________ Start time: ____________ Stop time: ____________ Movements: ____________ Date: ____________ Start time: ____________ Stop time: ____________ Movements: ____________ Date: ____________ Start time: ____________ Stop time: ____________ Movements: ____________ This information is not intended to replace advice given to you by your health care provider. Make sure you discuss any questions you have with your health care provider. Document Released: 01/19/2006 Document Revised: 01/09/2018 Document Reviewed: 01/29/2015 Elsevier Patient Education  2020 [employer].

## 2018-12-11 LAB — GLUCOSE TOLERANCE, 2 HOURS W/ 1HR
Glucose, 1 hour: 80 mg/dL (ref 65–179)
Glucose, 2 hour: 80 mg/dL (ref 65–152)
Glucose, Fasting: 80 mg/dL (ref 65–91)

## 2018-12-12 ENCOUNTER — Encounter: Payer: Self-pay | Admitting: *Deleted

## 2018-12-13 ENCOUNTER — Telehealth: Payer: Self-pay | Admitting: Family Medicine

## 2018-12-13 NOTE — Telephone Encounter (Signed)
Patient called because she wanted to know why she was scheduled for another glucose test.

## 2018-12-13 NOTE — Telephone Encounter (Signed)
Spoke w/pt and she stated that she read the MyChart message I had sent yesterday after she called our office with questions about her blood sugar test. Pt was explained that her test will need to be repeated due to the results are not valid and we are not sure why. Pt confirmed that she did not have anything to eat or drink during her test. I thanked pt for following directions and apologized for the inconvenience of needing the test again. She voiced understanding and will keep appt as scheduled.

## 2018-12-17 ENCOUNTER — Encounter: Payer: Self-pay | Admitting: Obstetrics and Gynecology

## 2018-12-17 ENCOUNTER — Other Ambulatory Visit: Payer: 59

## 2018-12-19 ENCOUNTER — Encounter: Payer: 59 | Admitting: Medical

## 2018-12-19 ENCOUNTER — Other Ambulatory Visit: Payer: Self-pay

## 2018-12-19 ENCOUNTER — Encounter: Payer: Self-pay | Admitting: Medical

## 2018-12-19 NOTE — Progress Notes (Signed)
Attempted to call pt at 1127; left VM stating I was calling to check pt in for virtual appt and will call again in 10 minutes.  Attempted to call pt a second time at 46; VM left stating this was my second attempt to contact pt and pt will need to reschedule appt with front office.  Apolonio Schneiders RN 12/19/18

## 2018-12-19 NOTE — Patient Instructions (Signed)
Please reschedule as soon as possible  

## 2018-12-23 ENCOUNTER — Ambulatory Visit (HOSPITAL_COMMUNITY)
Admission: EM | Admit: 2018-12-23 | Discharge: 2018-12-23 | Disposition: A | Discharge status: Home / Self Care | Payer: 59

## 2018-12-23 NOTE — ED Notes (Signed)
No answer from patient from lobby for registration

## 2018-12-24 ENCOUNTER — Ambulatory Visit (HOSPITAL_COMMUNITY)
Admission: EM | Admit: 2018-12-24 | Discharge: 2018-12-24 | Disposition: A | Discharge status: Home / Self Care | Payer: 59 | Attending: Family Medicine | Admitting: Family Medicine

## 2018-12-24 ENCOUNTER — Encounter (HOSPITAL_COMMUNITY): Payer: Self-pay

## 2018-12-24 ENCOUNTER — Other Ambulatory Visit: Payer: Self-pay

## 2018-12-24 DIAGNOSIS — K0889 Other specified disorders of teeth and supporting structures: Secondary | ICD-10-CM | POA: Diagnosis not present

## 2018-12-24 MED ORDER — CLINDAMYCIN HCL 150 MG PO CAPS: 300.0000 mg | ORAL_CAPSULE | Freq: Three times a day (TID) | ORAL | 0 refills | Status: AC

## 2018-12-24 NOTE — ED Provider Notes (Signed)
[facility]    CSN: [id number] Arrival date & time: 12/24/18  2820      History   Chief Complaint Chief Complaint  Patient presents with  . Dental Pain    HPI Elaine Stewart is a 23 y.o. female.    Dental Pain Location:  Lower Quality:  Aching Severity:  Moderate Onset quality:  Gradual Duration:  3 days Timing:  Constant Progression:  Waxing and waning Chronicity:  Recurrent Context: dental caries and dental fracture   Previous work-up:  Dental exam Relieved by:  Nothing Worsened by:  Nothing Ineffective treatments:  Acetaminophen Associated symptoms: facial swelling and gum swelling   Associated symptoms: no congestion, no difficulty swallowing, no drooling, no facial pain, no fever, no headaches, no neck pain, no neck swelling, no oral bleeding, no oral lesions and no trismus   Risk factors comment:  Pregnant    Past Medical History:  Diagnosis Date  . Acne 09/27/2010  . Arthritis   . Asthma affecting pregnancy, antepartum 05/07/2014  . BV (bacterial vaginosis) 01/13/2014    Patient Active Problem List   Diagnosis Date Noted  . COVID-19 affecting pregnancy in third trimester 11/19/2018  . Supervision of low-risk pregnancy 07/02/2018  . Anemia 12/05/2015  . 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 Rhinitis, allergic 09/29/2010  . Hypertrophy of tonsils and adenoids 09/27/2010    Past Surgical History:  Procedure Laterality Date  . NO PAST SURGERIES      OB History    Gravida  3   Para  1   Term  1   Preterm  0   AB  1   Living  1     SAB  1   TAB  0   Ectopic  0   Multiple  0   Live Births  1            Home Medications    Prior to Admission medications   Medication Sig Start Date End Date Taking? Authorizing Provider  Blood Pressure Monitoring (BLOOD PRESSURE KIT) DEVI 1 Device by Does not apply route as needed. ICD 10:  Z34.90 Patient not taking: Reported on 11/19/2018 08/30/18    Sloan Leiter, MD  clindamycin (CLEOCIN) 150 MG capsule Take 2 capsules (300 mg total) by mouth 3 (three) times daily for 7 days. 12/24/18 12/31/18  Orvan July, NP  Elastic Bandages & Supports (COMFORT FIT MATERNITY SUPP SM) MISC 1 Units by Does not apply route daily as needed. 12/09/18   Jorje Guild, NP  prenatal vitamin w/FE, FA (PRENATAL 1 + 1) 27-1 MG TABS tablet Take 1 tablet by mouth daily at 12 noon. 10/30/18   Jorje Guild, NP    Family History Family History  Problem Relation Age of Onset  . Healthy Mother   . Asthma Mother   . Healthy Father   . Diabetes Maternal Grandmother   . Diabetes Maternal Grandfather   . Diabetes Paternal Grandmother   . Diabetes Paternal Grandfather     Social History Social History   Tobacco Use  . Smoking status: Never Smoker  . Smokeless tobacco: Never Used  Substance Use Topics  . Alcohol use: No  . Drug use: No     Allergies   Penicillins   Review of Systems Review of Systems  Constitutional: Negative for fever.  HENT: Positive for facial swelling. Negative for congestion, drooling and mouth sores.   Musculoskeletal: Negative for neck pain.  Neurological: Negative for headaches.  Physical Exam Triage Vital Signs ED Triage Vitals  Enc Vitals Group     BP 12/24/18 0842 115/71     Pulse Rate 12/24/18 0842 98     Resp 12/24/18 0842 16     Temp 12/24/18 0842 98 F (36.7 C)     Temp Source 12/24/18 0842 Oral     SpO2 12/24/18 0842 100 %     Weight 12/24/18 0842 139 lb 6.4 oz (63.2 kg)     Height --      Head Circumference --      Peak Flow --      Pain Score 12/24/18 0919 3     Pain Loc --      Pain Edu? --      Excl. in [place]? --    No data found.  Updated Vital Signs BP 115/71 (BP Location: Right Arm)   Pulse 98   Temp 98 F (36.7 C) (Oral)   Resp 16   Wt 139 lb 6.4 oz (63.2 kg)   LMP 05/01/2018   SpO2 100%   BMI 25.50 kg/m   Visual Acuity Right Eye Distance:   Left Eye Distance:   Bilateral  Distance:    Right Eye Near:   Left Eye Near:    Bilateral Near:     Physical Exam Vitals and nursing note reviewed.  Constitutional:      General: She is not in acute distress.    Appearance: Normal appearance. She is not ill-appearing, toxic-appearing or diaphoretic.  HENT:     Head: Normocephalic.     Nose: Nose normal.     Mouth/Throat:     Dentition: Gingival swelling and dental caries present.     Pharynx: Oropharynx is clear.   Eyes:     Conjunctiva/sclera: Conjunctivae normal.  Pulmonary:     Effort: Pulmonary effort is normal.  Musculoskeletal:        General: Normal range of motion.     Cervical back: Normal range of motion.  Skin:    General: Skin is warm and dry.     Findings: No rash.  Neurological:     Mental Status: She is alert.  Psychiatric:        Mood and Affect: Mood normal.      UC Treatments / Results  Labs (all labs ordered are listed, but only abnormal results are displayed) Labs Reviewed - No data to display  EKG   Radiology No results found.  Procedures Procedures (including critical care time)  Medications Ordered in UC Medications - No data to display  Initial Impression / Assessment and Plan / UC Course  I have reviewed the triage vital signs and the nursing notes.  Pertinent labs & imaging results that were available during my care of the patient were reviewed by me and considered in my medical decision making (see chart for details).     Dental pain- treating with clindamycin based on PCN allergy.  She plans to follow up with dentist.  Tylenol for pain.   Final Clinical Impressions(s) / UC Diagnoses   Final diagnoses:  Pain, dental     Discharge Instructions     Take the antibiotic as prescribed  Tylenol for pain Follow up with your dentist as planned.     ED Prescriptions    Medication Sig Dispense Auth. Provider   clindamycin (CLEOCIN) 150 MG capsule Take 2 capsules (300 mg total) by mouth 3 (three)  times daily for 7 days. 42 capsule [place],  Betzaida Cremeens A, NP     PDMP not reviewed this encounter.   Orvan July, NP 12/24/18 [phone]

## 2018-12-24 NOTE — Discharge Instructions (Signed)
Take the antibiotic as prescribed  Tylenol for pain Follow up with your dentist as planned.

## 2018-12-24 NOTE — ED Triage Notes (Signed)
Pt states she has a bad tooth x 3 day. Pt states she has been taking tylenol and its not working for the pain.

## 2018-12-26 ENCOUNTER — Encounter: Payer: 59 | Admitting: Medical

## 2018-12-27 ENCOUNTER — Other Ambulatory Visit: Payer: Self-pay

## 2018-12-27 ENCOUNTER — Encounter (HOSPITAL_COMMUNITY): Payer: Self-pay | Admitting: Obstetrics and Gynecology

## 2018-12-27 ENCOUNTER — Inpatient Hospital Stay (HOSPITAL_COMMUNITY)
Admission: AD | Admit: 2018-12-27 | Discharge: 2018-12-27 | Disposition: A | Discharge status: Home / Self Care | Payer: 59 | Attending: Obstetrics and Gynecology | Admitting: Obstetrics and Gynecology

## 2018-12-27 DIAGNOSIS — Z3A34 34 weeks gestation of pregnancy: Secondary | ICD-10-CM | POA: Diagnosis not present

## 2018-12-27 DIAGNOSIS — Z3689 Encounter for other specified antenatal screening: Secondary | ICD-10-CM

## 2018-12-27 DIAGNOSIS — Z880 Allergy status to penicillin: Secondary | ICD-10-CM | POA: Diagnosis not present

## 2018-12-27 DIAGNOSIS — O4703 False labor before 37 completed weeks of gestation, third trimester: Secondary | ICD-10-CM | POA: Diagnosis not present

## 2018-12-27 LAB — WET PREP, GENITAL
Clue Cells Wet Prep HPF POC: NONE SEEN
Sperm: NONE SEEN
Trich, Wet Prep: NONE SEEN
Yeast Wet Prep HPF POC: NONE SEEN

## 2018-12-27 MED ORDER — BETAMETHASONE SOD PHOS & ACET 6 (3-3) MG/ML IJ SUSP: 12.0000 mg | INTRAMUSCULAR | Status: DC

## 2018-12-27 MED ORDER — TERBUTALINE SULFATE 1 MG/ML IJ SOLN
INTRAMUSCULAR | Status: AC
  Filled 2018-12-27: qty 1

## 2018-12-27 MED ORDER — TERBUTALINE SULFATE 1 MG/ML IJ SOLN: 0.2500 mg | Freq: Once | INTRAMUSCULAR | Status: DC

## 2018-12-27 MED ORDER — LACTATED RINGERS IV SOLN: INTRAVENOUS | Status: DC

## 2018-12-27 NOTE — Discharge Instructions (Signed)
Preventing Preterm Birth °Preterm birth is when your baby is delivered between 20 weeks and 37 weeks of pregnancy. A full-term pregnancy lasts for at least 37 weeks. Preterm birth can be dangerous for your baby because the last few weeks of pregnancy are an important time for your baby's brain and lungs to grow. Many things can cause a baby to be born early. Sometimes the cause is not known. There are certain factors that make you more likely to experience preterm birth, such as: °· Having a previous baby born preterm. °· Being pregnant with twins or other multiples. °· Having had fertility treatment. °· Being overweight or underweight at the start of your pregnancy. °· Having any of the following during pregnancy: °? An infection, including a urinary tract infection (UTI) or an STI (sexually transmitted infection). °? High blood pressure. °? Diabetes. °? Vaginal bleeding. °· Being age 23 or older. °· Being age 23 or younger. °· Getting pregnant within 6 months of a previous pregnancy. °· Suffering extreme stress or physical or emotional abuse during pregnancy. °· Standing for long periods of time during pregnancy, such as working at a job that requires standing. °What are the risks? °The most serious risk of preterm birth is that the baby may not survive. This is more likely to happen if a baby is born before 34 weeks. Other risks and complications of preterm birth may include your baby having: °· Breathing problems. °· Brain damage that affects movement and coordination (cerebral palsy). °· Feeding difficulties. °· Vision or hearing problems. °· Infections or inflammation of the digestive tract (colitis). °· Developmental delays. °· Learning disabilities. °· Higher risk for diabetes, heart disease, and high blood pressure later in life. °What can I do to lower my risk? ° °Medical care °The most important thing you can do to lower your risk for preterm birth is to get routine medical care during pregnancy (prenatal  care). If you have a high risk of preterm birth, you may be referred to a health care provider who specializes in managing high-risk pregnancies (perinatologist). You may be given medicine to help prevent preterm birth. °Lifestyle changes °Certain lifestyle changes can also lower your risk of preterm birth: °· Wait at least 6 months after a pregnancy to become pregnant again. °· Try to plan pregnancy for when you are between 23 and 23 years old. °· Get to a healthy weight before getting pregnant. If you are overweight, work with your health care provider to safely lose weight. °· Do not use any products that contain nicotine or tobacco, such as cigarettes and e-cigarettes. If you need help quitting, ask your health care provider. °· Do not drink alcohol. °· Do not use drugs. °Where to find support °For more support, consider: °· Talking with your health care provider. °· Talking with a therapist or substance abuse counselor, if you need help quitting. °· Working with a diet and nutrition specialist (dietitian) or a personal trainer to maintain a healthy weight. °· Joining a support group. °Where to find more information °Learn more about preventing preterm birth from: °· Centers for Disease Control and Prevention: cdc.gov/reproductivehealth/maternalinfanthealth/pretermbirth.htm °· March of Dimes: marchofdimes.org/complications/premature-babies.aspx °· American Pregnancy Association: americanpregnancy.org/labor-and-birth/premature-labor °Contact a health care provider if: °· You have any of the following signs of preterm labor before 37 weeks: °? A change or increase in vaginal discharge. °? Fluid leaking from your vagina. °? Pressure or cramps in your lower abdomen. °? A backache that does not go away or gets worse. °?   Regular tightening (contractions) in your lower abdomen. °Summary °· Preterm birth means having your baby during weeks 20-37 of pregnancy. °· Preterm birth may put your baby at risk for physical and  mental problems. °· Getting good prenatal care can help prevent preterm birth. °· You can lower your risk of preterm birth by making certain lifestyle changes, such as not smoking and not using alcohol. °This information is not intended to replace advice given to you by your health care provider. Make sure you discuss any questions you have with your health care provider. °Document Released: 02/03/2015 Document Revised: 12/02/2016 Document Reviewed: 08/29/2015 °Elsevier Patient Education © 2020 Elsevier Inc. ° °

## 2018-12-27 NOTE — MAU Provider Note (Signed)
History     CSN: [id number]  Arrival date and time: 12/27/18 1415   First Provider Initiated Contact with Patient 12/27/18 1449      Chief Complaint  Patient presents with  . Contractions   Kimberlye Dilger is a 23 y.o. G3P1011 at 24w2dwho [facility]  She presents today for Contractions.  Nurse calls provider to bedside stating patient reports rectal pressure and appears uncomfortable.  Patient reports contractions started about one hour ago and have been ongoing.  Patient endorses fetal movement and denies vaginal bleeding or leaking of fluid.  She denies problems during the pregnancy, but reports that her glucola was inconclusive.  Patient endorses sexual activity yesterday.  She denies vaginal discharge prior to the contractions.  She reports drinking 3 bottles of water today thus far.  Of note, patient is breathing through contractions and appears uncomfortable.      OB History    Gravida  3   Para  1   Term  1   Preterm  0   AB  1   Living  1     SAB  1   TAB  0   Ectopic  0   Multiple  0   Live Births  1           Past Medical History:  Diagnosis Date  . Acne 09/27/2010  . Arthritis   . Asthma affecting pregnancy, antepartum 05/07/2014  . BV (bacterial vaginosis) 01/13/2014    Past Surgical History:  Procedure Laterality Date  . NO PAST SURGERIES      Family History  Problem Relation Age of Onset  . Healthy Mother   . Asthma Mother   . Healthy Father   . Diabetes Maternal Grandmother   . Diabetes Maternal Grandfather   . Diabetes Paternal Grandmother   . Diabetes Paternal Grandfather     Social History   Tobacco Use  . Smoking status: Never Smoker  . Smokeless tobacco: Never Used  Substance Use Topics  . Alcohol use: No  . Drug use: No    Allergies:  Allergies  Allergen Reactions  . Penicillins Hives    Has patient had a PCN reaction causing immediate rash, facial/tongue/throat swelling, SOB or lightheadedness with  hypotension: Yes Has patient had a PCN reaction causing severe rash involving mucus membranes or skin necrosis: No Has patient had a PCN reaction that required hospitalization: No Has patient had a PCN reaction occurring within the last 10 years: Yes If all of the above answers are "NO", then may proceed with Cephalosporin use.     Medications Prior to Admission  Medication Sig Dispense Refill Last Dose  . Blood Pressure Monitoring (BLOOD PRESSURE KIT) DEVI 1 Device by Does not apply route as needed. ICD 10:  Z34.90 1 Device 0 12/27/2018 at Unknown time  . Elastic Bandages & Supports (COMFORT FIT MATERNITY SUPP SM) MISC 1 Units by Does not apply route daily as needed. 1 each 0 12/27/2018 at Unknown time  . prenatal vitamin w/FE, FA (PRENATAL 1 + 1) 27-1 MG TABS tablet Take 1 tablet by mouth daily at 12 noon. 30 tablet 11 12/27/2018 at Unknown time  . clindamycin (CLEOCIN) 150 MG capsule Take 2 capsules (300 mg total) by mouth 3 (three) times daily for 7 days. 42 capsule 0     Review of Systems  Gastrointestinal: Positive for abdominal pain (Contractions).  Genitourinary: Negative for difficulty urinating, dysuria, vaginal bleeding and vaginal discharge.   Physical  Exam   Blood pressure (!) 109/59, pulse 94, temperature 98.1 F (36.7 C), resp. rate 18, weight 64 kg, last menstrual period 05/01/2018, unknown if currently breastfeeding.  Physical Exam  Vitals reviewed. Constitutional: She is oriented to person, place, and time. She appears well-developed and well-nourished. She appears distressed (With contractions).  HENT:  Head: Normocephalic and atraumatic.  Eyes: Conjunctivae are normal.  Cardiovascular: Normal rate.  Respiratory: Effort normal.  GI:  Gravid--fundal height appears AGA, Soft, NT, Ctx palpate moderate   Genitourinary:    Genitourinary Comments: Cervical exam performed after blind swab collection of GC/CT and Wet Prep.    Musculoskeletal:     Cervical back:  Normal range of motion.  Neurological: She is alert and oriented to person, place, and time.  Skin: Skin is warm and dry.  Psychiatric: She has a normal mood and affect. Her behavior is normal.   Dilation: 1.5 Effacement (%): Thick Cervical Position: Middle Station: Ballotable Presentation: Vertex Exam by:: Gavin Pound, CNM  Fetal Assessment 155 bpm, Mod Var, -Decels, +Accels Toco: Q 2-48mn with irritability noted  MAU Course   Results for orders placed or performed during the hospital encounter of 12/27/18 (from the past 24 hour(s))  Wet prep, genital     Status: Abnormal   Collection Time: 12/27/18  2:50 PM   Specimen: PATH Cytology Cervicovaginal Ancillary Only  Result Value Ref Range   Yeast Wet Prep HPF POC NONE SEEN NONE SEEN   Trich, Wet Prep NONE SEEN NONE SEEN   Clue Cells Wet Prep HPF POC NONE SEEN NONE SEEN   WBC, Wet Prep HPF POC MANY (A) NONE SEEN   Sperm NONE SEEN    No results found.  MDM PE Start IV with Bolus Terbutaline Betamethasone  Labs: GC/CT and Wet Prep Collected EFM  Assessment and Plan  23year old G3P1011  SIUP at 34.2weeks Cat I FT Contractions  -Exam performed and findings discussed. -Reviewed how contractions are of concern, but cervix is only 1-2cm. -Discussed how sexual activity can contribute to contractions during pregnancy. -Informed that goal is to stop contractions and any further dilation of cervix.  -Informed that due to blood pressure unable to initiate procardia protocol to stop contractions. -Will start an IV and give bolus dose of LR. -Will also give terbutaline to stop contractions. -Discussed side effects of terbutaline particularly tachycardia -Patient expresses concern with getting a shot and an IV, stating that she doesn't do well with needles and requests time to "think about it." -Provider expresses understanding and verbal orders given.  -Nurse instructed to proceed as ordered. -NST Reactive  JMaryann Conners MSN, CNM 12/27/2018, 2:49 PM   Reassessment (3:08 PM)  -Nurse reports patient refuses terbutaline injection. -Will continue to monitor.  Reassessment (4:13 PM)  -In room to assess. -Patient reports that contractions have since subsided.  -Cervical exam with some softening, but is otherwise unchanged. -Discussed findings with patient and recommendation for BMZ dosing. -Patient reports she will discuss with FOB, but is ready to go home. -Dr. JRedmond Schoolconsulted regarding patient status, interventions, and recommendation for BMZ. *No modifications in plan of care.  *States okay for patient to be discharged 2 hours after initial check. -Patient calls out and states she would like to be discharged now as she needs to complete some holiday shopping. -Patient also reports that she does not desire BMZ dosing. -Nurse instructed to give PTL precautions and have patient call and reschedule missed office visit.  -Discharged to  home in stable condition.  Maryann Conners MSN, CNM Advanced Practice Provider, [facility]

## 2018-12-27 NOTE — MAU Note (Signed)
.   Elaine Stewart is a 23 y.o. at [redacted]w[redacted]d here in MAU reporting: contractions with pressure in her rectum. Denies any vaginal bleeding or LOF  Onset of complaint: an hour and a half Pain score:10 Vitals:   12/27/18 1432 12/27/18 1433  BP: (!) 109/59   Pulse: 94   Resp: 18   Temp:  98.1 F (36.7 C)     FHT:162 Lab orders placed from triage: UA

## 2018-12-31 LAB — GC/CHLAMYDIA PROBE AMP (~~LOC~~) NOT AT ARMC
Chlamydia: NEGATIVE
Comment: NEGATIVE
Comment: NORMAL
Neisseria Gonorrhea: NEGATIVE

## 2019-01-01 ENCOUNTER — Telehealth: Payer: Self-pay

## 2019-01-01 ENCOUNTER — Other Ambulatory Visit: Payer: Self-pay

## 2019-01-01 ENCOUNTER — Ambulatory Visit (INDEPENDENT_AMBULATORY_CARE_PROVIDER_SITE_OTHER): Payer: 59 | Admitting: Student

## 2019-01-01 DIAGNOSIS — Z3493 Encounter for supervision of normal pregnancy, unspecified, third trimester: Secondary | ICD-10-CM

## 2019-01-01 DIAGNOSIS — Z3A35 35 weeks gestation of pregnancy: Secondary | ICD-10-CM

## 2019-01-01 NOTE — Telephone Encounter (Signed)
Pt stated had not rcvd BP Cuff, so called [facility] & they stated pt picked up Cuff on 08/30/18.

## 2019-01-01 NOTE — Patient Instructions (Signed)
Oral Contraception Use Oral contraceptive pills (OCPs) are medicines that you take to prevent pregnancy. OCPs work by:  Preventing the ovaries from releasing eggs.  Thickening mucus in the lower part of the uterus (cervix), which prevents sperm from entering the uterus.  Thinning the lining of the uterus (endometrium), which prevents a fertilized egg from attaching to the endometrium. OCPs are highly effective when taken exactly as prescribed. However, OCPs do not prevent sexually transmitted infections (STIs). Safe sex practices, such as using condoms while on an OCP, can help prevent STIs. Before taking OCPs, you may have a physical exam, blood test, and Pap test. A Pap test involves taking a sample of cells from your cervix to check for cancer. Discuss with your health care provider the possible side effects of the OCP you may be prescribed. When you start an OCP, be aware that it can take 2-3 months for your body to adjust to changes in hormone levels. How to take oral contraceptive pills Follow instructions from your health care provider about how to start taking your first cycle of OCPs. Your health care provider may recommend that you:  Start the pill on day 1 of your menstrual period. If you start at this time, you will not need any backup form of birth control (contraception), such as condoms.  Start the pill on the first Sunday after your menstrual period or on the day you get your prescription. In these cases, you will need to use backup contraception for the first week.  Start the pill at any time of your cycle. ? If you take the pill within 5 days of the start of your period, you will not need a backup form of contraception. ? If you start at any other time of your menstrual cycle, you will need to use another form of contraception for 7 days. If your OCP is the type called a minipill, it will protect you from pregnancy after taking it for 2 days (48 hours), and you can stop using  backup contraception after that time. After you have started taking OCPs:  If you forget to take 1 pill, take it as soon as you remember. Take the next pill at the regular time.  If you miss 2 or more pills, call your health care provider. Different pills have different instructions for missed doses. Use backup birth control until your next menstrual period starts.  If you use a 28-day pack that contains inactive pills and you miss 1 of the last 7 pills (pills with no hormones), throw away the rest of the non-hormone pills and start a new pill pack. No matter which day you start the OCP, you will always start a new pack on that same day of the week. Have an extra pack of OCPs and a backup contraceptive method available in case you miss some pills or lose your OCP pack. Follow these instructions at home:  Do not use any products that contain nicotine or tobacco, such as cigarettes and e-cigarettes. If you need help quitting, ask your health care provider.  Always use a condom to protect against STIs. OCPs do not protect against STIs.  Use a calendar to mark the days of your menstrual period.  Read the information and directions that came with your OCP. Talk to your health care provider if you have questions. Contact a health care provider if:  You develop nausea and vomiting.  You have abnormal vaginal discharge or bleeding.  You develop a rash.    You miss your menstrual period. Depending on the type of OCP you are taking, this may be a sign of pregnancy. Ask your health care provider for more information.  You are losing your hair.  You need treatment for mood swings or depression.  You get dizzy when taking the OCP.  You develop acne after taking the OCP.  You become pregnant or think you may be pregnant.  You have diarrhea, constipation, and abdominal pain or cramps.  You miss 2 or more pills. Get help right away if:  You develop chest pain.  You develop shortness of  breath.  You have an uncontrolled or severe headache.  You develop numbness or slurred speech.  You develop visual or speech problems.  You develop pain, redness, and swelling in your legs.  You develop weakness or numbness in your arms or legs. Summary  Oral contraceptive pills (OCPs) are medicines that you take to prevent pregnancy.  OCPs do not prevent sexually transmitted infections (STIs). Always use a condom to protect against STIs.  When you start an OCP, be aware that it can take 2-3 months for your body to adjust to changes in hormone levels.  Read all the information and directions that come with your OCP. This information is not intended to replace advice given to you by your health care provider. Make sure you discuss any questions you have with your health care provider. Document Released: 12/09/2010 Document Revised: 04/13/2018 Document Reviewed: 02/01/2016 Elsevier Patient Education  2020 Elsevier Inc.  

## 2019-01-01 NOTE — Progress Notes (Signed)
   PRENATAL VISIT NOTE  Subjective:  Elaine Stewart is a 23 y.o. G3P1011 at [redacted]w[redacted]d being seen today for ongoing prenatal care.  She is currently monitored for the following issues for this low-risk pregnancy and has Hypertrophy of tonsils and adenoids; Polyarticular juvenile rheumatoid arthritis, chronic ([place]); Rhinitis, allergic; Anemia; Supervision of low-risk pregnancy; and COVID-19 affecting pregnancy in third trimester on their problem list.  Patient reports no complaints other than feeling.  Contractions: Irritability. Vag. Bleeding: None.  Movement: Present. Denies leaking of fluid.   The following portions of the patient's history were reviewed and updated as appropriate: allergies, current medications, past family history, past medical history, past social history, past surgical history and problem list.   Objective:   Vitals:   01/01/19 0900  BP: 111/69  Pulse: 77  Weight: 140 lb 9.6 oz (63.8 kg)    Fetal Status: Fetal Heart Rate (bpm): 152 Fundal Height: 35 cm Movement: Present     General:  Alert, oriented and cooperative. Patient is in no acute distress.  Skin: Skin is warm and dry. No rash noted.   Cardiovascular: Normal heart rate noted  Respiratory: Normal respiratory effort, no problems with respiration noted  Abdomen: Soft, gravid, appropriate for gestational age.  Pain/Pressure: Present     Pelvic: Cervical exam deferred        Extremities: Normal range of motion.  Edema: Trace  Mental Status: Normal mood and affect. Normal behavior. Normal judgment and thought content.   Assessment and Plan:  Pregnancy: G3P1011 at [redacted]w[redacted]d 1. Encounter for supervision of low-risk pregnancy in third trimester    -doing well; does not want a LARC and is thinking that she wants OCPs -Does not have her BP cuff; CMA  -strict pre-e precautions given, as patient does not have BP cuff.   Preterm labor symptoms and general obstetric precautions including but not limited to vaginal bleeding,  contractions, leaking of fluid and fetal movement were reviewed in detail with the patient. Please refer to After Visit Summary for other counseling recommendations.   Return in about 2 weeks (around 01/15/2019), or LROB in person.  No future appointments.  Elaine Stewart, CNM

## 2019-01-04 NOTE — L&D Delivery Note (Addendum)
OB/GYN Faculty Practice Delivery Note  Elaine Stewart is a 23 y.o. G3P1011 s/p SVD at [redacted]w[redacted]d. She was admitted for latent labor.   ROM: 4h 74m with light mec fluid GBS Status: Positive/-- (01/12 1624) Maximum Maternal Temperature: 98.7 F  Labor Progress: Patient presented to L&D for latent labor. Initial SVE: 3/60/-2. Labor course was uncomplicated. She then progressed to complete.   Delivery Date/Time: 02/01/2019 @ 0144 Delivery: Called to room and patient was complete and pushing. Head position was ROA and delivered with ease over the perineum. Nuchal cord present x1, loose. Delivery of the anterior shoulder was complicated by a compound hand, however posterior shoulder and compound hand delivered and the anterior shoulder followed with ease. Body delivered in usual fashion. Infant with spontaneous cry, placed on mother's abdomen, dried and stimulated. Cord clamped x 2 after 1-minute delay, and cut by FOB. Cord blood drawn. Placenta delivered spontaneously with gentle cord traction. Fundus firm with massage and pitocin started. Labia, perineum, vagina, and cervix inspected and significant for b/l 1st degree periurethral lacerations, both hemostatic.  Baby Weight: pending  Cord: central insertion, 3 vessel Placenta: Sent to L&D Complications: None Lacerations: b/l 1st degree periurethral lacerations. Right laceration was repaired iw/one stictch w/ 4-0 monocryl  EBL: 87cc Analgesia: None   Infant: APGAR (1 MIN): 9   APGAR (5 MINS): 9    Lorri Frederick, DO, PGY-1 OBGYN Faculty Teaching Service  02/01/2019, 2:03 AM    The above was performed under my direct supervision and guidance.

## 2019-01-15 ENCOUNTER — Other Ambulatory Visit: Payer: Self-pay

## 2019-01-15 ENCOUNTER — Other Ambulatory Visit (HOSPITAL_COMMUNITY)
Admission: RE | Admit: 2019-01-15 | Discharge: 2019-01-15 | Disposition: A | Discharge status: Home / Self Care | Payer: 59 | Source: Ambulatory Visit | Attending: Student | Admitting: Student

## 2019-01-15 ENCOUNTER — Other Ambulatory Visit: Payer: Self-pay | Admitting: Student

## 2019-01-15 ENCOUNTER — Ambulatory Visit (INDEPENDENT_AMBULATORY_CARE_PROVIDER_SITE_OTHER): Payer: 59 | Admitting: Student

## 2019-01-15 VITALS — BP 108/66 | HR 104 | Temp 98.0°F | Wt 147.4 lb

## 2019-01-15 DIAGNOSIS — Z3490 Encounter for supervision of normal pregnancy, unspecified, unspecified trimester: Secondary | ICD-10-CM | POA: Diagnosis not present

## 2019-01-15 DIAGNOSIS — Z3493 Encounter for supervision of normal pregnancy, unspecified, third trimester: Secondary | ICD-10-CM

## 2019-01-15 DIAGNOSIS — Z3A37 37 weeks gestation of pregnancy: Secondary | ICD-10-CM

## 2019-01-15 NOTE — Progress Notes (Signed)
Pt reports increased amount of UC's

## 2019-01-15 NOTE — Progress Notes (Signed)
Patient ID: Elaine Stewart, female   DOB: 07/25/1995, 24 y.o.   MRN: [id number]   PRENATAL VISIT NOTE  Subjective:  Elaine Stewart is a 24 y.o. 24 y.o. G3P1011 at [redacted]w[redacted]d being seen today for ongoing prenatal care.  She is currently monitored for the following issues for this low-risk pregnancy and has Hypertrophy of tonsils and adenoids; Polyarticular juvenile rheumatoid arthritis, chronic (H[facility]); Rhinitis, allergic; Anemia; Supervision of low-risk pregnancy; and COVID-19 affecting pregnancy in third trimester on their problem list.  Patient reports uterine contractions. .  Contractions: Irregular. Vag. Bleeding: None.  Movement: Present. Denies leaking of fluid.   The following portions of the patient's history were reviewed and updated as appropriate: allergies, current medications, past family history, past medical history, past social history, past surgical history and problem list.   Objective:   Vitals:   01/15/19 1447  BP: 108/66  Pulse: (!) 104  Temp: 98 F (36.7 C)  Weight: 147 lb 6.4 oz (66.9 kg)    Fetal Status: Fetal Heart Rate (bpm): 150 Fundal Height: 37 cm Movement: Present  Presentation: Vertex  General:  Alert, oriented and cooperative. Patient is in no acute distress.  Skin: Skin is warm and dry. No rash noted.   Cardiovascular: Normal heart rate noted  Respiratory: Normal respiratory effort, no problems with respiration noted  Abdomen: Soft, gravid, appropriate for gestational age.  Pain/Pressure: Present     Pelvic: Cervical exam performed Dilation: 1.5 Effacement (%): Thick    Extremities: Normal range of motion.  Edema: Trace  Mental Status: Normal mood and affect. Normal behavior. Normal judgment and thought content.   Assessment and Plan:  Pregnancy: G3P1011 at [redacted]w[redacted]d 1. Encounter for supervision of low-risk pregnancy, antepartum -Vertex by [place] today -Wants OCP for birth control at 6 weeks -Discussed plan for IOL at 41 weeks -Warning signs reviewed; patient checking BP  at home but not putting it in [place].  - Culture, Grp B Strep w/Rflx Suscept - GC/Chlamydia probe amp ([place])not at [facility]  Preterm labor symptoms and general obstetric precautions including but not limited to vaginal bleeding, contractions, leaking of fluid and fetal movement were reviewed in detail with the patient. Please refer to After Visit Summary for other counseling recommendations.   Return in about 1 week (around 01/22/2019), or LROB on My Chart.  No future appointments.  Marylene Land, CNM

## 2019-01-15 NOTE — Addendum Note (Signed)
Addended by: Ernestina Patches on: 01/15/2019 04:23 PM   Modules accepted: Orders

## 2019-01-16 LAB — GC/CHLAMYDIA PROBE AMP (~~LOC~~) NOT AT ARMC
Chlamydia: NEGATIVE
Comment: NEGATIVE
Comment: NORMAL
Neisseria Gonorrhea: NEGATIVE

## 2019-01-20 LAB — STREP GP B SUSCEPTIBILITY

## 2019-01-20 LAB — STREP GP B CULTURE+RFLX: Strep Gp B Culture+Rflx: POSITIVE — AB

## 2019-01-22 ENCOUNTER — Telehealth (INDEPENDENT_AMBULATORY_CARE_PROVIDER_SITE_OTHER): Payer: 59 | Admitting: Advanced Practice Midwife

## 2019-01-22 ENCOUNTER — Other Ambulatory Visit: Payer: Self-pay

## 2019-01-22 ENCOUNTER — Encounter: Payer: Self-pay | Admitting: Advanced Practice Midwife

## 2019-01-22 DIAGNOSIS — Z3493 Encounter for supervision of normal pregnancy, unspecified, third trimester: Secondary | ICD-10-CM

## 2019-01-22 DIAGNOSIS — Z3A38 38 weeks gestation of pregnancy: Secondary | ICD-10-CM

## 2019-01-22 NOTE — Progress Notes (Signed)
   TELEHEALTH VIRTUAL OBSTETRICS VISIT ENCOUNTER NOTE  I connected with Elaine Stewart on 01/22/19 at  1:35 PM EST by telephone at home and verified that I am speaking with the correct person using two identifiers.   I discussed the limitations, risks, security and privacy concerns of performing an evaluation and management service by telephone and the availability of in person appointments. I also discussed with the patient that there may be a patient responsible charge related to this service. The patient expressed understanding and agreed to proceed.  Subjective:  Elaine Stewart is a 24 y.o. G3P1011 at [redacted]w[redacted]d being followed for ongoing prenatal care.  She is currently monitored for the following issues for this low-risk pregnancy and has Hypertrophy of tonsils and adenoids; Polyarticular juvenile rheumatoid arthritis, chronic (HCC); Rhinitis, allergic; Anemia; Supervision of low-risk pregnancy; and COVID-19 affecting pregnancy in third trimester on their problem list.  Patient reports no complaints. Reports fetal movement. Denies any contractions, bleeding or leaking of fluid.   The following portions of the patient's history were reviewed and updated as appropriate: allergies, current medications, past family history, past medical history, past social history, past surgical history and problem list.   Objective:   General:  Alert, oriented and cooperative.   Mental Status: Normal mood and affect perceived. Normal judgment and thought content.  Rest of physical exam deferred due to type of encounter  Assessment and Plan:  Pregnancy: G3P1011 at [redacted]w[redacted]d 1. Encounter for supervision of low-risk pregnancy in third trimester - routine care  Term labor symptoms and general obstetric precautions including but not limited to vaginal bleeding, contractions, leaking of fluid and fetal movement were reviewed in detail with the patient.  I discussed the assessment and treatment plan with the patient. The  patient was provided an opportunity to ask questions and all were answered. The patient agreed with the plan and demonstrated an understanding of the instructions. The patient was advised to call back or seek an in-person office evaluation/go to MAU at [facility] for any urgent or concerning symptoms. Please refer to After Visit Summary for other counseling recommendations.   I provided 12 minutes of non-face-to-face time during this encounter.  Return in about 1 week (around 01/29/2019) for virtual visit .  No future appointments.  Thressa Sheller DNP, CNM  01/22/19  2:21 PM  Center for [employer], [facility]

## 2019-01-22 NOTE — Progress Notes (Signed)
I connected with  Elaine Stewart on 01/22/19 at  1:35 PM EST by telephone and verified that I am speaking with the correct person using two identifiers.   I discussed the limitations, risks, security and privacy concerns of performing an evaluation and management service by telephone and the availability of in person appointments. I also discussed with the patient that there may be a patient responsible charge related to this service. The patient expressed understanding and agreed to proceed.  Ernestina Patches, CMA 01/22/2019  1:39 PM

## 2019-01-23 ENCOUNTER — Encounter: Payer: Self-pay | Admitting: Student

## 2019-01-23 DIAGNOSIS — B951 Streptococcus, group B, as the cause of diseases classified elsewhere: Secondary | ICD-10-CM | POA: Insufficient documentation

## 2019-01-29 ENCOUNTER — Other Ambulatory Visit: Payer: Self-pay

## 2019-01-29 ENCOUNTER — Telehealth (INDEPENDENT_AMBULATORY_CARE_PROVIDER_SITE_OTHER): Payer: 59 | Admitting: Nurse Practitioner

## 2019-01-29 ENCOUNTER — Encounter: Payer: Self-pay | Admitting: Nurse Practitioner

## 2019-01-29 DIAGNOSIS — Z3493 Encounter for supervision of normal pregnancy, unspecified, third trimester: Secondary | ICD-10-CM

## 2019-01-29 DIAGNOSIS — Z3A39 39 weeks gestation of pregnancy: Secondary | ICD-10-CM

## 2019-01-29 NOTE — Progress Notes (Signed)
I connected with@ on 01/29/19 at  2:55 PM EST by: MyChart and verified that I am speaking with the correct person using two identifiers.  Patient is located at home and provider is located at [place] office.     The purpose of this virtual visit is to provide medical care while limiting exposure to the novel coronavirus. I discussed the limitations, risks, security and privacy concerns of performing an evaluation and management service by MyChart and the availability of in person appointments. I also discussed with the patient that there may be a patient responsible charge related to this service. By engaging in this virtual visit, you consent to the provision of healthcare.  Additionally, you authorize for your insurance to be billed for the services provided during this visit.  The patient expressed understanding and agreed to proceed.  The following staff members participated in the virtual visit: Nolene Bernheim, NP and Virgina Evener, CMA    PRENATAL VISIT NOTE  Subjective:  Elaine Stewart is a 24 y.o. G3P1011 at [redacted]w[redacted]d  for phone visit for ongoing prenatal care.  She is currently monitored for the following issues for this low-risk pregnancy and has Hypertrophy of tonsils and adenoids; Polyarticular juvenile rheumatoid arthritis, chronic (HCC); Rhinitis, allergic; Anemia; Supervision of low-risk pregnancy; COVID-19 affecting pregnancy in third trimester; and Positive GBS test on their problem list.  Patient reports occasional contractions.  Contractions: Irregular. Vag. Bleeding: None.  Movement: Present. Denies leaking of fluid.   The following portions of the patient's history were reviewed and updated as appropriate: allergies, current medications, past family history, past medical history, past social history, past surgical history and problem list.   Objective:  There were no vitals filed for this visit. Self-Obtained  Fetal Status:     Movement: Present     Assessment and Plan:  Pregnancy:  G3P1011 at [redacted]w[redacted]d 1. Encounter for supervision of low-risk pregnancy in third trimester Scheduled for induction - orders placed Please review instructions at next visit  Term labor symptoms and general obstetric precautions including but not limited to vaginal bleeding, contractions, leaking of fluid and fetal movement were reviewed in detail with the patient.  Return for appointments are already scheduled.  Future Appointments  Date Time Provider Department Center  02/06/2019  2:15 PM [facility] NST [facility] WOC  02/06/2019  4:15 PM Marny Lowenstein, PA-C [facility] WOC  02/12/2019  7:00 AM [facility]-LD SCHED ROOM [facility] None     Time spent on virtual visit: 10 minutes  Currie Paris, NP

## 2019-01-29 NOTE — Progress Notes (Signed)
I connected with  [name] on 01/29/19 at  2:55 PM EST by telephone and verified that I am speaking with the correct person using two identifiers.   I discussed the limitations, risks, security and privacy concerns of performing an evaluation and management service by telephone and the availability of in person appointments. I also discussed with the patient that there may be a patient responsible charge related to this service. The patient expressed understanding and agreed to proceed.  Janene Madeira Windsor Zirkelbach, CMA 01/29/2019  2:52 PM   induction for post dates 2/9/2021AM schedule

## 2019-01-30 ENCOUNTER — Inpatient Hospital Stay (HOSPITAL_COMMUNITY)
Admission: AD | Admit: 2019-01-30 | Discharge: 2019-02-02 | DRG: 807 | Disposition: A | Discharge status: Home / Self Care | Payer: 59 | Attending: Obstetrics and Gynecology | Admitting: Obstetrics and Gynecology

## 2019-01-30 ENCOUNTER — Other Ambulatory Visit: Payer: Self-pay

## 2019-01-30 ENCOUNTER — Encounter (HOSPITAL_COMMUNITY): Payer: Self-pay | Admitting: Obstetrics & Gynecology

## 2019-01-30 DIAGNOSIS — O480 Post-term pregnancy: Principal | ICD-10-CM | POA: Diagnosis present

## 2019-01-30 DIAGNOSIS — O9902 Anemia complicating childbirth: Secondary | ICD-10-CM | POA: Diagnosis present

## 2019-01-30 DIAGNOSIS — Z3A39 39 weeks gestation of pregnancy: Secondary | ICD-10-CM | POA: Diagnosis not present

## 2019-01-30 DIAGNOSIS — Z880 Allergy status to penicillin: Secondary | ICD-10-CM

## 2019-01-30 DIAGNOSIS — D649 Anemia, unspecified: Secondary | ICD-10-CM | POA: Diagnosis present

## 2019-01-30 DIAGNOSIS — O26893 Other specified pregnancy related conditions, third trimester: Secondary | ICD-10-CM | POA: Diagnosis present

## 2019-01-30 DIAGNOSIS — Z8616 Personal history of COVID-19: Secondary | ICD-10-CM | POA: Diagnosis present

## 2019-01-30 DIAGNOSIS — O99824 Streptococcus B carrier state complicating childbirth: Secondary | ICD-10-CM | POA: Diagnosis present

## 2019-01-30 DIAGNOSIS — Z3689 Encounter for other specified antenatal screening: Secondary | ICD-10-CM | POA: Diagnosis not present

## 2019-01-30 DIAGNOSIS — B951 Streptococcus, group B, as the cause of diseases classified elsewhere: Secondary | ICD-10-CM | POA: Diagnosis present

## 2019-01-30 DIAGNOSIS — Z3493 Encounter for supervision of normal pregnancy, unspecified, third trimester: Secondary | ICD-10-CM

## 2019-01-30 MED ORDER — TERBUTALINE SULFATE 1 MG/ML IJ SOLN: 0.2500 mg | Freq: Once | INTRAMUSCULAR | Status: DC | PRN

## 2019-01-30 MED ORDER — ONDANSETRON HCL 4 MG/2ML IJ SOLN
4.0000 mg | Freq: Four times a day (QID) | INTRAMUSCULAR | Status: DC | PRN
  Administered 2019-02-01: 4 mg via INTRAVENOUS
  Filled 2019-01-30: qty 2

## 2019-01-30 MED ORDER — OXYTOCIN 40 UNITS IN NORMAL SALINE INFUSION - SIMPLE MED: 2.5000 [IU]/h | INTRAVENOUS | Status: DC

## 2019-01-30 MED ORDER — LACTATED RINGERS IV SOLN: INTRAVENOUS | Status: DC

## 2019-01-30 MED ORDER — FLEET ENEMA 7-19 GM/118ML RE ENEM: 1.0000 | ENEMA | RECTAL | Status: DC | PRN

## 2019-01-30 MED ORDER — MISOPROSTOL 25 MCG QUARTER TABLET: 25.0000 ug | ORAL_TABLET | ORAL | Status: DC | PRN

## 2019-01-30 MED ORDER — LACTATED RINGERS IV SOLN
500.0000 mL | INTRAVENOUS | Status: DC | PRN
  Administered 2019-01-31: 500 mL via INTRAVENOUS

## 2019-01-30 MED ORDER — LIDOCAINE HCL (PF) 1 % IJ SOLN: 30.0000 mL | INTRAMUSCULAR | Status: DC | PRN

## 2019-01-30 MED ORDER — ACETAMINOPHEN 325 MG PO TABS
650.0000 mg | ORAL_TABLET | ORAL | Status: DC | PRN
  Administered 2019-02-01: 650 mg via ORAL
  Filled 2019-01-30: qty 2

## 2019-01-30 MED ORDER — SOD CITRATE-CITRIC ACID 500-334 MG/5ML PO SOLN: 30.0000 mL | ORAL | Status: DC | PRN

## 2019-01-30 MED ORDER — OXYCODONE-ACETAMINOPHEN 5-325 MG PO TABS: 1.0000 | ORAL_TABLET | ORAL | Status: DC | PRN

## 2019-01-30 MED ORDER — OXYTOCIN BOLUS FROM INFUSION
500.0000 mL | Freq: Once | INTRAVENOUS | Status: AC
  Administered 2019-02-01: 500 mL via INTRAVENOUS

## 2019-01-30 MED ORDER — OXYCODONE-ACETAMINOPHEN 5-325 MG PO TABS: 2.0000 | ORAL_TABLET | ORAL | Status: DC | PRN

## 2019-01-30 MED ORDER — VANCOMYCIN HCL IN DEXTROSE 1-5 GM/200ML-% IV SOLN
1000.0000 mg | Freq: Two times a day (BID) | INTRAVENOUS | Status: DC
  Administered 2019-01-31 (×2): 1000 mg via INTRAVENOUS
  Filled 2019-01-30 (×2): qty 200

## 2019-01-30 NOTE — Progress Notes (Signed)
Dr Lily Peer notified of pt's admission and status. Aware of ctx pattern and repeat sve. Aware pt more uncomfortable. Will admit to [facility]

## 2019-01-30 NOTE — H&P (Addendum)
LABOR AND DELIVERY ADMISSION HISTORY AND PHYSICAL NOTE  Elaine Stewart is a 24 y.o. female G3P1011 with IUP at 25w1dby LMP presenting for SOL.   States she has had irregular contractions x2w however today they have been more intense and occurring more frequently. She reports positive fetal movement. She denies leakage of fluid, vaginal bleeding. Endorses contractions occurring q583m.   She plans on breast feeding. Her contraception plan is: OCPs.  [facility]/Com[facility] , CWD, normal anatomy, cephalic presentation, anterior placenta, 3866%ZLDPregnancy complications:  - COVID during third trimester (11/12/2018) - Anemia - GBS positive  Past Medical History: Past Medical History:  Diagnosis Date  . Acne 09/27/2010  . Arthritis   . Asthma affecting pregnancy, antepartum 05/07/2014  . BV (bacterial vaginosis) 01/13/2014    Past Surgical History: Past Surgical History:  Procedure Laterality Date  . NO PAST SURGERIES      Obstetrical History: OB History    Gravida  3   Para  1   Term  1   Preterm  0   AB  1   Living  1     SAB  1   TAB  0   Ectopic  0   Multiple  0   Live Births  1           Social History: Social History   Socioeconomic History  . Marital status: Single    Spouse name: Not on file  . Number of children: 1  . Years of education: 12+  . Highest education level: Not on file  Occupational History  . 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 Occupation: waProofreaderTobacco Use  . Smoking status: Never Smoker  . Smokeless tobacco: Never Used  Substance and Sexual Activity  . Alcohol use: No  . Drug use: No  . Sexual activity: Yes    Partners: Male    Birth control/protection: None  Other Topics Concern  . Not on file  Social History Narrative   [employer] courses.   Lives with her mother and her son (07/2014).   Social Determinants of Health   Financial Resource  Strain:   . Difficulty of Paying Living Expenses: Not on file  Food Insecurity:   . Worried About RuCharity fundraisern the Last Year: Not on file  . Ran Out of Food in the Last Year: Not on file  Transportation Needs:   . Lack of Transportation (Medical): Not on file  . Lack of Transportation (Non-Medical): Not on file  Physical Activity:   . Days of Exercise per Week: Not on file  . Minutes of Exercise per Session: Not on file  Stress:   . Feeling of Stress : Not on file  Social Connections:   . Frequency of Communication with Friends and Family: Not on file  . Frequency of Social Gatherings with Friends and Family: Not on file  . Attends Religious Services: Not on file  . Active Member of Clubs or Organizations: Not on file  . Attends ClArchivisteetings: Not on file  . Marital Status: Not on file    Family History: Family History  Problem Relation Age of Onset  . Healthy Mother   . Asthma Mother   . Healthy Father   . Diabetes Maternal Grandmother   . Diabetes Maternal Grandfather   . Diabetes Paternal Grandmother   . Diabetes Paternal Grandfather  Allergies: Allergies  Allergen Reactions  . Penicillins Hives    Has patient had a PCN reaction causing immediate rash, facial/tongue/throat swelling, SOB or lightheadedness with hypotension: Yes Has patient had a PCN reaction causing severe rash involving mucus membranes or skin necrosis: No Has patient had a PCN reaction that required hospitalization: No Has patient had a PCN reaction occurring within the last 10 years: Yes If all of the above answers are "NO", then may proceed with Cephalosporin use.     Medications Prior to Admission  Medication Sig Dispense Refill Last Dose  . Blood Pressure Monitoring (BLOOD PRESSURE KIT) DEVI 1 Device by Does not apply route as needed. ICD 10:  Z34.90 (Patient not taking: Reported on 01/01/2019) 1 Device 0   . Elastic Bandages & Supports (COMFORT FIT MATERNITY SUPP  SM) MISC 1 Units by Does not apply route daily as needed. (Patient not taking: Reported on 01/01/2019) 1 each 0   . prenatal vitamin w/FE, FA (PRENATAL 1 + 1) 27-1 MG TABS tablet Take 1 tablet by mouth daily at 12 noon. 30 tablet 11      Review of Systems  All systems reviewed and negative except as stated in HPI  Physical Exam Blood pressure 117/66, pulse 90, temperature 98.2 F (36.8 C), temperature source Oral, resp. rate 19, weight 68.4 kg, last menstrual period 05/01/2018, SpO2 100 %, unknown if currently breastfeeding. General appearance: alert, oriented, NAD Lungs: normal respiratory effort Heart: regular rate Abdomen: soft, non-tender; gravid Extremities: No calf swelling or tenderness Presentation: cephalic by SVE  Fetal monitoring: Baseline: 150 bpm, Variability: Good {> 6 bpm), Accelerations: Reactive and Decelerations: Variable: moderate Uterine activity: Frequency: Every 4 minutes, Duration: 60 seconds and Intensity: moderate  Dilation: 4.5 Effacement (%): 60 Station: -2 Exam by:: Blima Singer rNC  Prenatal labs: ABO, Rh: AB/Positive/-- (07/13 0938) Antibody: Positive, See Final Results (07/13 0938) Rubella: 1.47 (07/13 0938) RPR: Non Reactive (12/02 0911)  HBsAg: Negative (07/13 0938)  HIV: Non Reactive (12/02 0911)  GC/Chlamydia: Positive 06/2018, TOC 07/2018  GBS: Positive/-- (01/12 1624)  2-hr GTT: Normal Genetic screening: Not performed Anatomy US: Normal growth, no structural fetal anomalies.  Prenatal Transfer Tool  Maternal Diabetes: No Genetic Screening: Declined Maternal Ultrasounds/Referrals: Normal Fetal Ultrasounds or other Referrals:  None Maternal Substance Abuse:  No Significant Maternal Medications:  None Significant Maternal Lab Results: Group B Strep positive  No results found for this or any previous visit (from the past 24 hour(s)).  Patient Active Problem List   Diagnosis Date Noted  . Positive GBS test 01/23/2019  . COVID-19  affecting pregnancy in third trimester 11/19/2018  . Supervision of low-risk pregnancy 07/02/2018  . Anemia 12/05/2015  . Polyarticular juvenile rheumatoid arthritis, chronic ([place]) 09/29/2010  . Rhinitis, allergic 09/29/2010  . Hypertrophy of tonsils and adenoids 09/27/2010    Assessment: Elaine Stewart is a 24 y.o. G3P1011 at 59w1dhere for SOL  #Labor: Progressing normally, expectant labor management. #Fetal Wellbeing:  Category I #Pain Control: IV pain meds, declines epidural #GBS/ID: GBS positive, vancomycin (d/t PCN ALL) #COVID: swab pending #MOF: Breast #MOC: OCPs #Circ: Yes, patient unsure whether in/out yet #Anticipated MOD: NSVD   ABud Face DO, PGY-1 Family Medicine Resident, [facility]  01/30/2019, 11:14 PM   CNM attestation:  I have seen and examined this patient; I agree with above documentation in the resident's note.   JLatrece Nittais a 25y.o. G3P1011 here for latent labor; has made minimal cx change in MAU  PE: BP 118/73   Pulse 91   Temp 98.2 F (36.8 C) (Oral)   Resp 17   Ht '5\' 2"'  (1.575 m)   Wt 68.4 kg   LMP 05/01/2018   SpO2 100%   BMI 27.56 kg/m   Resp: normal effort, no distress Abd: gravid  ROS, labs, PMH reviewed  Plan: Admit to labor and delivery Manage expectantly Anticipate vag del  Myrtis Ser CNM 01/31/2019, 5:17 AM

## 2019-01-30 NOTE — Addendum Note (Signed)
Addended by: Currie Paris on: 01/30/2019 12:08 AM   Modules accepted: SmartSet

## 2019-01-30 NOTE — Progress Notes (Signed)
Pt states ctxs are stronger

## 2019-01-30 NOTE — MAU Note (Signed)
Patient reports to MAU c/o ctx every 5-8 min. No bleeding. +FM. Pt is unsure if her water is broke because she has been having a white discharge since 2 days ago and she states that sometimes it is watery. Pt reports a slow leak with her last baby.

## 2019-01-31 ENCOUNTER — Encounter (HOSPITAL_COMMUNITY): Payer: Self-pay | Admitting: Family Medicine

## 2019-01-31 ENCOUNTER — Other Ambulatory Visit: Payer: Self-pay

## 2019-01-31 LAB — CBC
HCT: 35.5 % — ABNORMAL LOW (ref 36.0–46.0)
Hemoglobin: 11.2 g/dL — ABNORMAL LOW (ref 12.0–15.0)
MCH: 27.7 pg (ref 26.0–34.0)
MCHC: 31.5 g/dL (ref 30.0–36.0)
MCV: 87.9 fL (ref 80.0–100.0)
Platelets: 356 10*3/uL (ref 150–400)
RBC: 4.04 MIL/uL (ref 3.87–5.11)
RDW: 14.4 % (ref 11.5–15.5)
WBC: 10.6 10*3/uL — ABNORMAL HIGH (ref 4.0–10.5)
nRBC: 0 % (ref 0.0–0.2)

## 2019-01-31 LAB — RPR: RPR Ser Ql: NONREACTIVE

## 2019-01-31 MED ORDER — FENTANYL CITRATE (PF) 100 MCG/2ML IJ SOLN
50.0000 ug | INTRAMUSCULAR | Status: DC | PRN
  Administered 2019-01-31 (×2): 100 ug via INTRAVENOUS
  Administered 2019-01-31: 50 ug via INTRAVENOUS
  Administered 2019-02-01: 100 ug via INTRAVENOUS
  Filled 2019-01-31 (×4): qty 2

## 2019-01-31 MED ORDER — OXYTOCIN 40 UNITS IN NORMAL SALINE INFUSION - SIMPLE MED
1.0000 m[IU]/min | INTRAVENOUS | Status: DC
  Administered 2019-01-31: 2 m[IU]/min via INTRAVENOUS
  Filled 2019-01-31: qty 1000

## 2019-01-31 MED ORDER — DIPHENHYDRAMINE HCL 50 MG/ML IJ SOLN
INTRAMUSCULAR | Status: AC
  Filled 2019-01-31: qty 1

## 2019-01-31 MED ORDER — DIPHENHYDRAMINE HCL 50 MG/ML IJ SOLN
25.0000 mg | Freq: Three times a day (TID) | INTRAMUSCULAR | Status: DC | PRN
  Administered 2019-01-31 (×2): 25 mg via INTRAVENOUS
  Filled 2019-01-31: qty 1

## 2019-01-31 NOTE — Discharge Summary (Signed)
Postpartum Discharge Summary     Patient Name: Elaine Stewart DOB: 08-Mar-1995 MRN: [id number]  Date of admission: 01/30/2019 Delivering Provider: Marylouise Stacks   Date of discharge: 02/02/2019  Admitting diagnosis: Post term pregnancy at [redacted] weeks gestation [O48.0, Z3A.41] Intrauterine pregnancy: [redacted]w[redacted]d    Secondary diagnosis:  Active Problems:   COVID-19 affecting pregnancy in third trimester   Positive GBS test   [redacted] weeks gestation of pregnancy   Labor and delivery indication for care or intervention  Additional problems: None     Discharge diagnosis: Term Pregnancy Delivered                                                                                                Post partum procedures:None  Augmentation: Pitocin  Complications: None  Hospital course:  Onset of Labor With Vaginal Delivery     24y.o. yo G3P1011 at 385w2das admitted in Latent Labor on 01/30/2019. Patient had an uncomplicated labor course as follows: augmented w/ pitocin. Membrane Rupture Time/Date: 9:36 PM ,01/31/2019   Intrapartum Procedures: Episiotomy: None [1]                                         Lacerations:  Periurethral [8]  Patient had a delivery of a Viable infant. Information for the patient's newborn:  TuJoselynne, Killam0[[id number]]Delivery Method: VBAC     Pateint had an uncomplicated postpartum course. Considering POPs but did not want prescribed on discharge. She is ambulating, tolerating a regular diet, passing flatus, and urinating well. Patient is discharged home in stable condition on 02/02/19.  Delivery time:  0144  Magnesium Sulfate received: No BMZ received: No Rhophylac:No MMR:No Transfusion:No  Physical exam  Vitals:   02/01/19 0500 02/01/19 0851 02/01/19 1303 02/01/19 2040  BP: 110/66 109/71 102/61 104/68  Pulse: 75 (!) 58 62 65  Resp: _0 Temp: 98.1 F (36.7 C) 97.6 F (36.4 C) 98.1 F (36.7 C) 97.9 F (36.6 C)  TempSrc: Oral Oral Oral Oral   SpO2: 100%   99%  Weight:      Height:       General: alert, cooperative and no distress Lochia: appropriate Uterine Fundus: firm Incision: N/A DVT Evaluation: No evidence of DVT seen on physical exam. Labs: Lab Results  Component Value Date   WBC 10.6 (H) 01/30/2019   HGB 11.2 (L) 01/30/2019   HCT 35.5 (L) 01/30/2019   MCV 87.9 01/30/2019   PLT 356 01/30/2019   CMP Latest Ref Rng & Units 12/19/2017  Glucose 70 - 99 mg/dL 114(H)  BUN 6 - 20 mg/dL 10  Creatinine 0.44 - 1.00 mg/dL 0.85  Sodium 135 - 145 mmol/L 141  Potassium 3.5 - 5.1 mmol/L 3.8  Chloride 98 - 111 mmol/L 105  CO2 22 - 32 mmol/L 24  Calcium 8.9 - 10.3 mg/dL 9.2  Total Protein 6.5 - 8.1 g/dL 7.4  Total Bilirubin 0.3 - 1.2 mg/dL 0.5  Alkaline Phos 38 -  126 U/L 56  AST 15 - 41 U/L 16  ALT 0 - 44 U/L 12    Discharge instruction: per After Visit Summary and "Baby and Me Booklet".  After visit meds:  Allergies as of 02/02/2019      Reactions   Penicillins Hives   Has patient had a PCN reaction causing immediate rash, facial/tongue/throat swelling, SOB or lightheadedness with hypotension: Yes Has patient had a PCN reaction causing severe rash involving mucus membranes or skin necrosis: No Has patient had a PCN reaction that required hospitalization: No Has patient had a PCN reaction occurring within the last 10 years: Yes If all of the above answers are "NO", then may proceed with Cephalosporin use.      Medication List    TAKE these medications   acetaminophen 325 MG tablet Commonly known as: Tylenol Take 2 tablets (650 mg total) by mouth every 6 (six) hours as needed (for pain scale < 4).   Blood Pressure Kit Devi 1 Device by Does not apply route as needed. ICD 10:  Z34.90   [place] 1 Units by Does not apply route daily as needed.   ibuprofen 600 MG tablet Commonly known as: ADVIL Take 1 tablet (600 mg total) by mouth every 6 (six) hours.   prenatal vitamin w/FE, FA  27-1 MG Tabs tablet Take 1 tablet by mouth daily at 12 noon.       Diet: routine diet  Activity: Advance as tolerated. Pelvic rest for 6 weeks.   Outpatient follow up:4 weeks Follow up Appt: Future Appointments  Date Time Provider [place]  03/05/2019 10:15 AM Burleson, Rona Ravens, NP [facility] WOC   Follow up Visit:    Please schedule this patient for Postpartum visit in: 4 weeks with the following provider: Any provider Virtual For C/S patients schedule nurse incision check in weeks 2 weeks:  Low risk pregnancy complicated by:  Delivery mode:  SVD Anticipated Birth Control:  POPs PP Procedures needed:   Schedule Integrated BH visit: no     Newborn Data: Live born adult  Birth Weight: 3388g  APGAR (1 MIN): 9   APGAR (5 MINS): 9   APGAR (10 MINS):    Newborn Delivery   Birth date/time:  Delivery type:       Baby Feeding: Breast Disposition:home with mother   02/02/2019 Chauncey Mann, MD

## 2019-01-31 NOTE — Progress Notes (Addendum)
Elaine Stewart is a 24 y.o. female G3P1011 with IUP at [redacted]w[redacted]d by LMP presenting for SOL.   Subjective: Reports feeling mild intermittent discomfort with contractions, denies need for pain medication at this time. Is considering IV pain medications PRN. FOB present and supportive at bedside.  Objective: Vitals:   01/31/19 0537 01/31/19 0715 01/31/19 0810 01/31/19 0930  BP: 115/62 (!) 106/55 99/62 114/62  Pulse: 76 89 92 81  Resp: 16 16 16    Temp: 98 F (36.7 C) 98.2 F (36.8 C)    TempSrc: Oral Oral    SpO2:      Weight:      Height:        No intake/output data recorded. No intake/output data recorded.    FHT:  FHR: 150 bpm, variability: moderate,  accelerations:  Present,  decelerations:  Absent UC:   regular, every 7-10 minutes SVE:   Dilation: 5 Effacement (%): 60 Station: -2 Exam by:: Dr. [email or web address], Lily Peer Cornetto RN  Labs:   Recent Labs    01/30/19 2340  WBC 10.6*  HGB 11.2*  HCT 35.5*  PLT 356    Assessment / Plan: 24 y.o. female G3P1011 with IUP at [redacted]w[redacted]d by LMP presenting for SOL.  Labor:  Prodromal labor - Plan to start Pitocin 2x2 at this time per protocol. Frequent position changes encouraged, along with use of birthing ball. Preeclampsia:  no signs or symptoms of toxicity and labs stable Fetal Wellbeing:  Category I Pain Control:  Labor support without medications - Considering IV pain meds PRN I/D:   GBS positive - Vanc for prophlyaxis per protocol Anticipated MOD:  NSVD  [redacted]w[redacted]d, SNM, BSN 01/31/2019, 9:46 AM  I confirm that I have verified the information documented in the nurse midwife student's note and that I have also personally reperformed the history, physical exam and all medical decision making activities of this service and have verified that all service and findings are accurately documented in this student's note.    02/02/2019, CNM 01/31/2019 10:37 AM

## 2019-01-31 NOTE — Progress Notes (Addendum)
Elaine Stewart is a 24 y.o. female G3P1011 with IUP at [redacted]w[redacted]d by LMP presenting for SOL.   Subjective: Continues to report mild intermittent discomfort with contractions and denies need for pain medication at this time. Patient declined Pitocin augmentation following Vancomycin administration and requests to start Pitocin at 1500.  Objective: Vitals:   01/31/19 0930 01/31/19 1020 01/31/19 1130 01/31/19 1230  BP: 114/62 103/68 104/64 (!) 93/51  Pulse: 81 (!) 101 80 78  Resp: 14 16 14 16   Temp:      TempSrc:      SpO2:      Weight:      Height:        No intake/output data recorded. No intake/output data recorded.   FHT:  FHR: 145 bpm, variability: moderate,  accelerations:  Present,  decelerations:  Absent UC:   regular, every 10-12 minutes SVE:   Dilation: 5 Effacement (%): 60 Station: -2 Exam by:: [email or web address] RN  Labs:   Recent Labs    01/30/19 2340  WBC 10.6*  HGB 11.2*  HCT 35.5*  PLT 356    Assessment / Plan: 24 y.o. female G3P1011 with IUP at [redacted]w[redacted]d by LMP presenting for SOL.  Labor:  Prodromal labor - Plan to begin Pitocin augmentation per protocol at 1500 per patient request.  Preeclampsia:  no signs or symptoms of toxicity and labs stable Fetal Wellbeing:  Category I Pain Control:  Labor support without medications I/D:   GBS positive - Vancomycin for prophylaxis Anticipated MOD:  NSVD  [redacted]w[redacted]d, SNM, BSN 01/31/2019, 2:34 PM  I confirm that I have verified the information documented in the nurse midwife student's note and that I have also personally reperformed the history, physical exam and all medical decision making activities of this service and have verified that all service and findings are accurately documented in this student's note.  -Patient adamant that she does not want water broken and that she wants to start pitocin at 3 pm.  -Patient has been counseled that the longer delays in induction, the longer she will labor and her hospital course  will be longer.    02/02/2019, CNM 01/31/2019 4:04 PM

## 2019-01-31 NOTE — Progress Notes (Addendum)
Elaine Stewart is a 24 y.o. female G3P1011 with IUP at [redacted]w[redacted]d by LMP presenting for SOL.  Subjective: Reports feeling mild intermittent discomfort with contractions, denies need for pain medication at this time. Has been performing nipple stimulation in room with breast pumps and reports increase in frequency and intensity of contractions as a result. Will consider IV pain medication after Pitocin is started for augmentation. FOB present and supportive at bedside.  Objective: Vitals:   01/31/19 0930 01/31/19 1020 01/31/19 1130 01/31/19 1230  BP: 114/62 103/68 104/64 (!) 93/51  Pulse: 81 (!) 101 80 78  Resp: 14 16 14 16   Temp:      TempSrc:      SpO2:      Weight:      Height:        No intake/output data recorded. No intake/output data recorded.   FHT:  FHR: 140 bpm, variability: moderate,  accelerations:  Present,  decelerations:  Absent UC:   regular, every 2-10 minutes SVE:   Dilation: 5 Effacement (%): 60 Station: -2 Exam by:: [email or web address] RN  Labs:   Recent Labs    01/30/19 2340  WBC 10.6*  HGB 11.2*  HCT 35.5*  PLT 356    Assessment / Plan: 24 y.o. female G3P1011 with IUP at [redacted]w[redacted]d by LMP presenting for SOL.  Labor:  Prodromal labor - Plan to begin Pitocin augmentation following administration of Vancomycin due to patient preference of having one medication adminsitered at a time. Preeclampsia:  no signs or symptoms of toxicity and labs stable Fetal Wellbeing:  Category I Pain Control:  Labor support without medications - Considering IV pain meds PRN I/D:   GBS positive - Vancomycin for prophylaxis. Patient concerned about potential adverse reaction following last dose that consisted of chills, heart palpitations, and pruritis. Dosed patient with IV Benadryl at this time prior to administering Vancomycin. Anticipated MOD:  NSVD  [redacted]w[redacted]d, SNM, BSN 01/31/2019, 12:56 PM   I confirm that I have verified the information documented in the nurse midwife student's  note and that I have also personally reperformed the history, physical exam and all medical decision making activities of this service and have verified that all service and findings are accurately documented in this student's note.   -Patient reported chills and "feeling weird" after prophylactic Benadryl; however, she felt better after 10 minutes while SNM and I were at the bedside and agreed to start Vancomycin.   02/02/2019, CNM 01/31/2019 2:01 PM

## 2019-01-31 NOTE — Progress Notes (Signed)
Patient ID: Elaine Stewart, female   DOB: 09-19-1995, 24 y.o.   MRN: [id number]  Resting; states ctx have become less strong; would like to proceed with essential induction; has rec'd Vanc x 1 dose  BP 106/55, P 89 FHR 150s, +accels, no decels, Cat 1 Ctx irreg 6-9 mins  IUP@39 .2wks Prodromal labor GBS pos  Will start Pitocin 2x2 for induction Continue Vanc q 12h Anticipate vag del  Arabella Merles [facility] 01/31/2019

## 2019-01-31 NOTE — Progress Notes (Addendum)
Elaine Stewart is a 24 y.o. female G3P1011 with IUP at [redacted]w[redacted]d by LMP presenting for SOL.  Subjective: Reports moderate intermittent discomfort with contractions and denies need for pain medication at this time. FOB present and supportive at bedside.  Objective: Vitals:   01/31/19 1510 01/31/19 1600 01/31/19 1700 01/31/19 1730  BP: 107/65 (!) 105/58 (!) 109/55 (!) 108/57  Pulse: 82 85 93 81  Resp: 16 14 16 14   Temp:  98.3 F (36.8 C)    TempSrc:  Oral    SpO2:      Weight:      Height:        No intake/output data recorded. No intake/output data recorded.   FHT:  FHR: 150 bpm, variability: moderate,  accelerations:  Present,  decelerations:  Absent UC:   regular, every 2-4 minutes SVE:   Dilation: 5 Effacement (%): 60 Station: -2 Exam by:: [email or web address] RN  Labs:   Recent Labs    01/30/19 2340  WBC 10.6*  HGB 11.2*  HCT 35.5*  PLT 356    Assessment / Plan: 24 y.o. female G3P1011 with IUP at [redacted]w[redacted]d by LMP presenting for SOL.   Labor: Progressing normally on Pitocin, continue to titrate per protocol. Patient not interested in AROM during labor and would prefer SROM or en-caul delivery. Frequent position changes encouraged. Preeclampsia:  no signs or symptoms of toxicity and labs stable Fetal Wellbeing:  Category I Pain Control:  Labor support without medications I/D:   GBS positive - Vancomycin for prophlyaxis Anticipated MOD:  NSVD  [redacted]w[redacted]d, SNM, BSN 01/31/2019, 6:31 PM  I confirm that I have verified the information documented in the nurse midwife student's note and that I have also personally reperformed the history, physical exam and all medical decision making activities of this service and have verified that all service and findings are accurately documented in this student's note.    02/02/2019, CNM 01/31/2019 7:08 PM

## 2019-01-31 NOTE — Progress Notes (Signed)
   Elaine Stewart is a 24 y.o. G3P1011 at [redacted]w[redacted]d  admitted for latent labor.   Subjective: Patient now ambulating around in the room, wanting to "get things going". She desires AROM if no cervical change.   Objective: Vitals:   01/31/19 1510 01/31/19 1600 01/31/19 1700 01/31/19 1730  BP: 107/65 (!) 105/58 (!) 109/55 (!) 108/57  Pulse: 82 85 93 81  Resp: 16 14 16 14   Temp:  98.3 F (36.8 C)    TempSrc:  Oral    SpO2:      Weight:      Height:       No intake/output data recorded.  FHT:  FHR: 150 bpm, variability: moderate,  accelerations:  Present,  decelerations:  Absent UC:   regular, every 3 minutes SVE:   Dilation: 5 Effacement (%): 60 Station: -2 Exam by:: K Antonella Upson CNM Pitocin @ 8 mu/min  Labs: Lab Results  Component Value Date   WBC 10.6 (H) 01/30/2019   HGB 11.2 (L) 01/30/2019   HCT 35.5 (L) 01/30/2019   MCV 87.9 01/30/2019   PLT 356 01/30/2019    Assessment / Plan: Patient is unchanged from 3 pm exam, agrees to AROM at 8:30 pm (1 hour). She desires an unmedicated birth so has been reluctant to start pitocin or AROM, but now feels ready for these interventions.   Labor: no change since 3 pm, now amenable to AROM Fetal Wellbeing:  Category I Pain Control:  Labor support without medications Anticipated MOD:  NSVD  02/01/2019 01/31/2019, 7:30 PM

## 2019-01-31 NOTE — Progress Notes (Signed)
LABOR PROGRESS NOTE  Elaine Stewart is a 24 y.o. G3P1011 at [redacted]w[redacted]d  admitted for latent labor  Subjective: Patient doing well, comfortable and has no complaints at this time. Is agreeable to increasing pitocin, but hesitant about AROM at this time.  Objective: BP 120/72   Pulse 89   Temp 97.7 F (36.5 C) (Oral)   Resp 16   Ht 5\' 2"  (1.575 m)   Wt 68.4 kg   LMP 05/01/2018   SpO2 100%   BMI 27.56 kg/m   Dilation: 5 Effacement (%): 60 Cervical Position: Posterior Station: -2 Presentation: Vertex Exam by:: K Kooistra CNM Fetal monitoring: Baseline: 150 bpm, Variability: Good {> 6 bpm), Accelerations: Reactive and Decelerations: Absent Uterine activity: Frequency: Every 3-5 minutes and Intensity: moderate  Labs: Lab Results  Component Value Date   WBC 10.6 (H) 01/30/2019   HGB 11.2 (L) 01/30/2019   HCT 35.5 (L) 01/30/2019   MCV 87.9 01/30/2019   PLT 356 01/30/2019    Patient Active Problem List   Diagnosis Date Noted  . Positive GBS test 01/23/2019  . COVID-19 affecting pregnancy in third trimester 11/19/2018  . Supervision of low-risk pregnancy 07/02/2018  . Anemia 12/05/2015  . 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 Rhinitis, allergic 09/29/2010  . Hypertrophy of tonsils and adenoids 09/27/2010    Assessment / Plan: Augmentation of labor, progressing well  #Labor: Progressing on Pitocin, will continue to increase then AROM if patient agreeable. Pitocin @ 10 mu/min #Fetal Wellbeing:  Category I #Pain Control: IV pain meds #ID: GBS (+), vanc #Anticipated MOD: NSVD   09/29/2010, DO, PGY-1 Family Medicine Resident, [facility] Faculty Teaching Service  01/31/2019, 8:56 PM

## 2019-01-31 NOTE — Progress Notes (Signed)
LABOR PROGRESS NOTE  Elaine Stewart is a 24 y.o. G3P1011 at [redacted]w[redacted]d admitted for SOL  Subjective: Patient feeling more uncomfortable with associated increased perineal pressure.   Objective: BP 115/62   Pulse 76   Temp 98 F (36.7 C) (Oral)   Resp 16   Ht 5\' 2"  (1.575 m)   Wt 68.4 kg   LMP 05/01/2018   SpO2 100%   BMI 27.56 kg/m   Dilation: 5 Effacement (%): 60 Cervical Position: Anterior Station: -2 Presentation: Vertex Exam by:: Dr. [email or web address], Lily Peer Cornetto RN Fetal monitoring: Baseline: 140 bpm, Variability: Good {> 6 bpm), Accelerations: Reactive and Decelerations: Absent Uterine activity: Frequency: Every 5-10 minutes  Labs: Lab Results  Component Value Date   WBC 10.6 (H) 01/30/2019   HGB 11.2 (L) 01/30/2019   HCT 35.5 (L) 01/30/2019   MCV 87.9 01/30/2019   PLT 356 01/30/2019    Patient Active Problem List   Diagnosis Date Noted  . Positive GBS test 01/23/2019  . COVID-19 affecting pregnancy in third trimester 11/19/2018  . Supervision of low-risk pregnancy 07/02/2018  . Anemia 12/05/2015  . 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 Rhinitis, allergic 09/29/2010  . Hypertrophy of tonsils and adenoids 09/27/2010    Assessment / Plan: Spontaneous labor, progressing normally  #Labor: Patient has had minimal change since admission with toco ~q61m. I discussed with her the option of either discharging home with expectant labor management and return precautions, or beginning labor augmentation this morning. She would like to think about her decision for a few minutes and will let her nurse know what will she would like to do with regard to labor management.  #Fetal Wellbeing:  Category I #Pain Control: Labor support without medications, IV pain medications upon request #ID: GBS positive, vanc #Anticipated MOD: NSVD   9m, DO, PGY-1 Family Medicine Resident, [facility] Faculty Teaching Service  01/31/2019, 5:49 AM

## 2019-01-31 NOTE — Progress Notes (Signed)
Vitals:   01/31/19 2036 01/31/19 2114  BP: 120/72 101/61  Pulse: 89 73  Resp: 16 16  Temp:    SpO2:     Pt feeling some pressure, asking for more IV pain meds.  Had SROM of light mec fluid at 2136. Cx 8.5/90/-2.  Pitocin at 12 mu/min.  FHR Cat 1, ctx q 1.5-3 minutes.  IV fentanyl X 1 more dose, anticipate SVD soon.

## 2019-02-01 ENCOUNTER — Encounter (HOSPITAL_COMMUNITY): Payer: Self-pay | Admitting: Family Medicine

## 2019-02-01 DIAGNOSIS — O99824 Streptococcus B carrier state complicating childbirth: Secondary | ICD-10-CM

## 2019-02-01 DIAGNOSIS — Z3A39 39 weeks gestation of pregnancy: Secondary | ICD-10-CM

## 2019-02-01 MED ORDER — SENNOSIDES-DOCUSATE SODIUM 8.6-50 MG PO TABS
2.0000 | ORAL_TABLET | ORAL | Status: DC
  Administered 2019-02-01: 2 via ORAL
  Filled 2019-02-01: qty 2

## 2019-02-01 MED ORDER — DIPHENHYDRAMINE HCL 25 MG PO CAPS: 25.0000 mg | ORAL_CAPSULE | Freq: Four times a day (QID) | ORAL | Status: DC | PRN

## 2019-02-01 MED ORDER — IBUPROFEN 600 MG PO TABS
600.0000 mg | ORAL_TABLET | Freq: Four times a day (QID) | ORAL | Status: DC
  Administered 2019-02-01 – 2019-02-02 (×7): 600 mg via ORAL
  Filled 2019-02-01 (×7): qty 1

## 2019-02-01 MED ORDER — COCONUT OIL OIL: 1.0000 "application " | TOPICAL_OIL | Status: DC | PRN

## 2019-02-01 MED ORDER — PRENATAL MULTIVITAMIN CH
1.0000 | ORAL_TABLET | Freq: Every day | ORAL | Status: DC
  Administered 2019-02-01 – 2019-02-02 (×3): 1 via ORAL
  Filled 2019-02-01 (×3): qty 1

## 2019-02-01 MED ORDER — ONDANSETRON HCL 4 MG PO TABS: 4.0000 mg | ORAL_TABLET | ORAL | Status: DC | PRN

## 2019-02-01 MED ORDER — ONDANSETRON HCL 4 MG/2ML IJ SOLN: 4.0000 mg | INTRAMUSCULAR | Status: DC | PRN

## 2019-02-01 MED ORDER — SIMETHICONE 80 MG PO CHEW: 80.0000 mg | CHEWABLE_TABLET | ORAL | Status: DC | PRN

## 2019-02-01 MED ORDER — ZOLPIDEM TARTRATE 5 MG PO TABS: 5.0000 mg | ORAL_TABLET | Freq: Every evening | ORAL | Status: DC | PRN

## 2019-02-01 MED ORDER — WITCH HAZEL-GLYCERIN EX PADS: 1.0000 "application " | MEDICATED_PAD | CUTANEOUS | Status: DC | PRN

## 2019-02-01 MED ORDER — OXYCODONE HCL 5 MG PO TABS: 5.0000 mg | ORAL_TABLET | ORAL | Status: DC | PRN

## 2019-02-01 MED ORDER — TETANUS-DIPHTH-ACELL PERTUSSIS 5-2.5-18.5 LF-MCG/0.5 IM SUSP: 0.5000 mL | Freq: Once | INTRAMUSCULAR | Status: DC

## 2019-02-01 MED ORDER — BENZOCAINE-MENTHOL 20-0.5 % EX AERO
1.0000 "application " | INHALATION_SPRAY | CUTANEOUS | Status: DC | PRN
  Administered 2019-02-01: 1 via TOPICAL
  Filled 2019-02-01: qty 56

## 2019-02-01 MED ORDER — OXYCODONE HCL 5 MG PO TABS: 10.0000 mg | ORAL_TABLET | ORAL | Status: DC | PRN

## 2019-02-01 MED ORDER — DIBUCAINE (PERIANAL) 1 % EX OINT: 1.0000 "application " | TOPICAL_OINTMENT | CUTANEOUS | Status: DC | PRN

## 2019-02-01 MED ORDER — ACETAMINOPHEN 325 MG PO TABS
650.0000 mg | ORAL_TABLET | ORAL | Status: DC | PRN
  Administered 2019-02-01: 650 mg via ORAL
  Filled 2019-02-01: qty 2

## 2019-02-01 NOTE — Lactation Note (Signed)
This note was copied from a baby's chart. Lactation Consultation Note  Patient Name: Elaine Stewart [id number] Date: 02/01/2019 Reason for consult: Initial assessment;Term P2, 19 hour female infant. Infant had stools but on voids since birth. Tools given: harmony hand pump, DEBP mom's choice to use pump while in hospital and breast shells due to have flat nipples. Mom's feeding choice at admission was breast and formula feeding. Mom is active on the [facility] program in [facility] she doesn't have breast pump at home. Mom is experienced at breastfeeding she breastfed her 24 year old for six months. LC gave mom harmony hand pump. Per mom, yesterday she was very tired and only wanted to formula fed infant at that time. Per mom, infant latches very well, infant had formula prior to [facility] entering the room  dad fed infant  20 mls of formula. Per mom, she wants to use DEBP while in hospital [facility] set mom up with pump, mom was still pumping when LC left room and had already pumped 8 mls of colostrum. LC discussed with mom about establishing her milk supply and maybe latching infant at breast first and offering formula afterwards. Mom has decided to stop the formula feeding, she will latch infant at breast and use DEBP her choice. Mom knows to breastfeed infant according to hunger cues, 8 to 12 times within 24 hours and on demand, not exceed 3 hours without breastfeeding infant. Parents will do as much STS as possible. Mom will wear breast shells during the day in bra to help evert nipple shaft out more due to having flat nipples. Mom will pre-pump prior to latching infant at breast. Mom knows to call RN or LC if she needs assistance with latching infant at breast. Mom shown how to use DEBP & how to disassemble, clean, & reassemble parts. Reviewed Baby & Me book's Breastfeeding Basics.  Mom made aware of O/P services, breastfeeding support groups, community resources, and our phone # for post-discharge  questions.   Maternal Data Formula Feeding for Exclusion: Yes Reason for exclusion: Mother's choice to formula and breast feed on admission Has patient been taught Hand Expression?: Yes Does the patient have breastfeeding experience prior to this delivery?: Yes  Feeding Feeding Type: Bottle Fed - Formula Nipple Type: Slow - flow  LATCH Score                   Interventions Interventions: Breast feeding basics reviewed;Breast compression;Skin to skin;Hand express;Expressed milk;Hand pump;DEBP  Lactation Tools Discussed/Used WIC Program: Yes Pump Review: Setup, frequency, and cleaning;Milk Storage Initiated by:: Danelle Earthly, IBCLC Date initiated:: 02/01/19   Consult Status Consult Status: Follow-up Date: 02/02/19 Follow-up type: In-patient    Danelle Earthly 02/01/2019, 9:36 PM

## 2019-02-01 NOTE — Progress Notes (Signed)
Vitals:   01/31/19 2246 01/31/19 2314  BP: (!) 102/57   Pulse: 85   Resp: 16 14  Temp: 98.7 F (37.1 C)   SpO2:     C/O pressure.  No change in cx/station since exam @ 2230 (8-9/90/-2).  Ctx have spaced out to 1.5-4 minutes.  FHR Cat 1, occ early decel. Leaking light mec fluid. Pitocin at 12 mu/min. Recommended IUPC, pt refused. Will increase pitocin until frequency of ctx improves.

## 2019-02-02 DIAGNOSIS — Z3A39 39 weeks gestation of pregnancy: Secondary | ICD-10-CM

## 2019-02-02 MED ORDER — IBUPROFEN 600 MG PO TABS: 600.0000 mg | ORAL_TABLET | Freq: Four times a day (QID) | ORAL | 0 refills | Status: DC

## 2019-02-02 MED ORDER — ACETAMINOPHEN 325 MG PO TABS: 650.0000 mg | ORAL_TABLET | Freq: Four times a day (QID) | ORAL | 0 refills | Status: DC | PRN

## 2019-02-02 NOTE — Lactation Note (Signed)
This note was copied from a baby's chart. Lactation Consultation Note  Patient Name: Elaine Stewart [id number] Date: 02/02/2019 Reason for consult: Follow-up assessment   LC student entered room for follow-up assessment. Both parents present at time of entry. Mother of infant sitting up in bed, with father of baby holding infant.  Mother states that she is going to continue stimulating breasts in order to promote milk production. Mother states that she has not had much to eat or drink, which could be the reason she is not able to get much out of the pump or while expressing at all. [facility] student reminded mother of baby to stimulate breasts and offer the breast first, then follow up with formula at every feed as needed. Mother understood and stated that she would continue to work on stimulation and pumping. Mother of baby stated that she understood breastfeeding basics. Mother of baby states that she has a pump at home and she will continue to use it.   Both parents made aware of outpatient lactation services. Follow-up as needed.    Maternal Data    Feeding Feeding Type: Bottle Fed - Formula  LATCH Score                   Interventions    Lactation Tools Discussed/Used     Consult Status Consult Status: PRN Date: 02/02/19(Only if mother requests) Follow-up type: Call as needed    Stevan Born [facility] 02/02/2019, 2:02 PM

## 2019-02-03 LAB — BPAM RBC
Blood Product Expiration Date: 202102232359
Blood Product Expiration Date: 202102232359
Unit Type and Rh: 6200
Unit Type and Rh: 6200

## 2019-02-03 LAB — TYPE AND SCREEN
ABO/RH(D): AB POS
Antibody Screen: POSITIVE
Donor AG Type: NEGATIVE
Donor AG Type: NEGATIVE
Unit division: 0
Unit division: 0

## 2019-02-04 ENCOUNTER — Encounter: Payer: Self-pay | Admitting: Family Medicine

## 2019-02-06 ENCOUNTER — Other Ambulatory Visit: Payer: Self-pay

## 2019-02-06 ENCOUNTER — Encounter: Payer: 59 | Admitting: Medical

## 2019-02-06 ENCOUNTER — Other Ambulatory Visit: Payer: 59

## 2019-02-06 ENCOUNTER — Encounter (HOSPITAL_COMMUNITY): Payer: Self-pay | Admitting: Emergency Medicine

## 2019-02-06 ENCOUNTER — Ambulatory Visit (HOSPITAL_COMMUNITY)
Admission: EM | Admit: 2019-02-06 | Discharge: 2019-02-06 | Disposition: A | Discharge status: Home / Self Care | Payer: 59 | Attending: Urgent Care | Admitting: Urgent Care

## 2019-02-06 DIAGNOSIS — K0889 Other specified disorders of teeth and supporting structures: Secondary | ICD-10-CM

## 2019-02-06 DIAGNOSIS — K047 Periapical abscess without sinus: Secondary | ICD-10-CM

## 2019-02-06 MED ORDER — HYDROCODONE-ACETAMINOPHEN 5-325 MG PO TABS
ORAL_TABLET | ORAL | Status: AC
  Filled 2019-02-06: qty 1

## 2019-02-06 MED ORDER — NAPROXEN 500 MG PO TABS: 500.0000 mg | ORAL_TABLET | Freq: Two times a day (BID) | ORAL | 0 refills | Status: DC

## 2019-02-06 MED ORDER — HYDROCODONE-ACETAMINOPHEN 5-325 MG PO TABS: 1.0000 | ORAL_TABLET | Freq: Three times a day (TID) | ORAL | 0 refills | Status: AC | PRN

## 2019-02-06 MED ORDER — CLINDAMYCIN HCL 300 MG PO CAPS: 300.0000 mg | ORAL_CAPSULE | Freq: Three times a day (TID) | ORAL | 0 refills | Status: DC

## 2019-02-06 MED ORDER — CEFTRIAXONE SODIUM 1 G IJ SOLR
INTRAMUSCULAR | Status: AC
  Filled 2019-02-06: qty 10

## 2019-02-06 MED ORDER — METRONIDAZOLE 500 MG PO TABS: 500.0000 mg | ORAL_TABLET | Freq: Two times a day (BID) | ORAL | 0 refills | Status: DC

## 2019-02-06 MED ORDER — HYDROCODONE-ACETAMINOPHEN 5-325 MG PO TABS
1.0000 | ORAL_TABLET | Freq: Once | ORAL | Status: AC
  Administered 2019-02-06: 1 via ORAL

## 2019-02-06 MED ORDER — CEFTRIAXONE SODIUM 1 G IJ SOLR
1.0000 g | Freq: Once | INTRAMUSCULAR | Status: AC
  Administered 2019-02-06: 1 g via INTRAMUSCULAR

## 2019-02-06 MED ORDER — LIDOCAINE HCL (PF) 1 % IJ SOLN
INTRAMUSCULAR | Status: AC
  Filled 2019-02-06: qty 2

## 2019-02-06 NOTE — ED Notes (Signed)
Medication administration delayed to allow patient time to pump for infant.

## 2019-02-06 NOTE — Discharge Instructions (Signed)
He have a severe dental infection and due to your penicillin allergy need different types of antibiotics to help with this.  This means that you cannot breast-feed while on these medications including the ones will use for pain.  Make sure that you keep your appointment with your dentist but if you continue to worsen including fevers, worsening pain and swelling, difficulty opening your mouth or controlling your saliva then please present back to our clinic for recheck.

## 2019-02-06 NOTE — ED Triage Notes (Signed)
Pt seen six weeks ago here for right lower dental pain.  Pt only took 3-4 days worth of her antibiotic.  Pt is returning for recurrent right lower dental pain x4 days.  Pt states she started taking her left over antibiotics with no relief.  Pt also reports she has been taking 600-1200mg  of ibuprofen with no relief from the pain.  Pt instructed not to take two 600mg  ibuprofen at one time.  The maximum dose should be 800 mg.

## 2019-02-06 NOTE — ED Notes (Signed)
Patient requesting to pump for her baby prior to receiving medication.  Medicine contraindicated in breast feeding

## 2019-02-06 NOTE — ED Provider Notes (Signed)
[place]   MRN: [id number] DOB: 1995/09/04  Subjective:   Elaine Stewart is a 24 y.o. female presenting for persistent right lower dental infection.  She was last seen at the end of December for this, prescribed clindamycin but only took a few days worth.  She recently saw her dentist again and was advised to recheck with [place] about an antibiotic course prior to having dental work done.  She restarted clindamycin but her pain is now more severe including difficulty with chewing or drinking fluids.  Patient did just give birth and is breast-feeding.  However, her pain is so severe that she is not opposed to holding off of breast-feeding so that she could get treatment.  She does have a history of penicillin allergy, has hives with this.  No current facility-administered medications for this encounter.  Current Outpatient Medications:  .  ibuprofen (ADVIL) 600 MG tablet, Take 1 tablet (600 mg total) by mouth every 6 (six) hours., Disp: 30 tablet, Rfl: 0 .  acetaminophen (TYLENOL) 325 MG tablet, Take 2 tablets (650 mg total) by mouth every 6 (six) hours as needed (for pain scale < 4)., Disp: 30 tablet, Rfl: 0 .  Blood Pressure Monitoring (BLOOD PRESSURE KIT) DEVI, 1 Device by Does not apply route as needed. ICD 10:  Z34.90 (Patient not taking: Reported on 01/01/2019), Disp: 1 Device, Rfl: 0 .  Elastic Bandages & Supports (COMFORT FIT MATERNITY SUPP SM) MISC, 1 Units by Does not apply route daily as needed. (Patient not taking: Reported on 01/01/2019), Disp: 1 each, Rfl: 0 .  prenatal vitamin w/FE, FA (PRENATAL 1 + 1) 27-1 MG TABS tablet, Take 1 tablet by mouth daily at 12 noon., Disp: 30 tablet, Rfl: 11   Allergies  Allergen Reactions  . Penicillins Hives    Has patient had a PCN reaction causing immediate rash, facial/tongue/throat swelling, SOB or lightheadedness with hypotension: Yes Has patient had a PCN reaction causing severe rash involving mucus membranes or skin necrosis: No Has  patient had a PCN reaction that required hospitalization: No Has patient had a PCN reaction occurring within the last 10 years: Yes If all of the above answers are "NO", then may proceed with Cephalosporin use.     Past Medical History:  Diagnosis Date  . Acne 09/27/2010  . Arthritis   . Asthma affecting pregnancy, antepartum 05/07/2014  . BV (bacterial vaginosis) 01/13/2014     Past Surgical History:  Procedure Laterality Date  . NO PAST SURGERIES      Family History  Problem Relation Age of Onset  . Healthy Mother   . Asthma Mother   . Healthy Father   . Diabetes Maternal Grandmother   . Diabetes Maternal Grandfather   . Diabetes Paternal Grandmother   . Diabetes Paternal Grandfather     Social History   Tobacco Use  . Smoking status: Never Smoker  . Smokeless tobacco: Never Used  Substance Use Topics  . Alcohol use: No  . Drug use: No    ROS   Objective:   Vitals: BP 114/76 (BP Location: Left Arm)   Pulse (!) 57   Temp 98.1 F (36.7 C) (Oral)   Resp 18   SpO2 99%   Breastfeeding Yes   Physical Exam Constitutional:      General: She is in acute distress (From her dental pain).     Appearance: Normal appearance. She is well-developed. She is not ill-appearing, toxic-appearing or diaphoretic.  HENT:     Head:  Normocephalic and atraumatic.     Nose: Nose normal.     Mouth/Throat:     Mouth: Mucous membranes are moist.     Pharynx: Oropharynx is clear.   Eyes:     General: No scleral icterus.    Extraocular Movements: Extraocular movements intact.     Pupils: Pupils are equal, round, and reactive to light.  Cardiovascular:     Rate and Rhythm: Normal rate.  Pulmonary:     Effort: Pulmonary effort is normal.  Skin:    General: Skin is warm and dry.  Neurological:     General: No focal deficit present.     Mental Status: She is alert and oriented to person, place, and time.  Psychiatric:        Mood and Affect: Mood normal.        Behavior:  Behavior normal.        Thought Content: Thought content normal.        Judgment: Judgment normal.      Assessment and Plan :   1. Dental abscess   2. Dental infection   3. Toothache    IM ceftriaxone in clinic.  Patient has severe pain and we discussed her holding off breast-feeding until this pain is better controlled and she can see her dentist.  She was given hydrocodone in clinic, is to use naproxen as an outpatient.  Hydrocodone can be used for breakthrough pain. Refilled clindamycin, also take Flagyl. Counseled patient on potential for adverse effects with medications prescribed/recommended today, strict ER and return-to-clinic precautions discussed, patient verbalized understanding.    Jaynee Eagles, PA-C 02/06/19 1215

## 2019-02-08 ENCOUNTER — Ambulatory Visit (INDEPENDENT_AMBULATORY_CARE_PROVIDER_SITE_OTHER)
Admission: RE | Admit: 2019-02-08 | Discharge: 2019-02-08 | Disposition: A | Discharge status: Home / Self Care | Payer: 59 | Source: Ambulatory Visit

## 2019-02-08 DIAGNOSIS — K047 Periapical abscess without sinus: Secondary | ICD-10-CM | POA: Diagnosis not present

## 2019-02-08 MED ORDER — AMOXICILLIN-POT CLAVULANATE 875-125 MG PO TABS: 1.0000 | ORAL_TABLET | Freq: Two times a day (BID) | ORAL | 0 refills | Status: DC

## 2019-02-08 NOTE — Discharge Instructions (Signed)
Stop the Flagyl and start the amoxicillin. Continue the clindamycin.  Warm compresses.  If your symptoms worsen go to the ER

## 2019-02-08 NOTE — ED Provider Notes (Signed)
Virtual Visit via Video Note:  Inez Stantz  initiated request for Telemedicine visit with [facility] team. I connected with [name]  on 02/08/2019 at 11:06 AM  for a synchronized telemedicine visit using a video enabled HIPPA compliant telemedicine application. I verified that I am speaking with [name]  using two identifiers. Janace Aris, NP  was physically located in a [facility] Urgent care site and Lasheba Stevens was located at a different location.   The limitations of evaluation and management by telemedicine as well as the availability of in-person appointments were discussed. Patient was informed that she  may incur a bill ( including co-pay) for this virtual visit encounter. Destinie Thornsberry  expressed understanding and gave verbal consent to proceed with virtual visit.     History of Present Illness:Elaine Stewart  is a 24 y.o. female presents with right lower facial swelling. This started yesterday. She was seen 2 days ago and treated for dental infection. She has been taking clindamycin and flagyl. Her pain has improved but now she is having swelling. No fever, trismus. She has been taking the pain medications.     Past Medical History:  Diagnosis Date  . Acne 09/27/2010  . Arthritis   . Asthma affecting pregnancy, antepartum 05/07/2014  . BV (bacterial vaginosis) 01/13/2014    Allergies  Allergen Reactions  . Penicillins Hives    Has patient had a PCN reaction causing immediate rash, facial/tongue/throat swelling, SOB or lightheadedness with hypotension: Yes Has patient had a PCN reaction causing severe rash involving mucus membranes or skin necrosis: No Has patient had a PCN reaction that required hospitalization: No Has patient had a PCN reaction occurring within the last 10 years: Yes If all of the above answers are "NO", then may proceed with Cephalosporin use.         Observations/Objective:VITALS: Per patient if applicable, see vitals. GENERAL: Alert,  appears well and in no acute distress. HEENT: Atraumatic, conjunctiva clear, no obvious abnormalities on inspection of external nose and ears. Right lower facial swelling, no trismus.  NECK: Normal movements of the head and neck. CARDIOPULMONARY: No increased WOB. Speaking in clear sentences. I:E ratio WNL.  MS: Moves all visible extremities without noticeable abnormality. PSYCH: Pleasant and cooperative, well-groomed. Speech normal rate and rhythm. Affect is appropriate. Insight and judgement are appropriate. Attention is focused, linear, and appropriate.  NEURO: CN grossly intact. Oriented as arrived to appointment on time with no prompting. Moves both UE equally.  SKIN: No obvious lesions, wounds, erythema, or cyanosis noted on face or hands.     Assessment and Plan: dental abscess- will have her continue the clindamycin and change the flagyl to Augmentin.  If problems worsen she will need to go to the ER. Strict ER precautions given.  Pt understanding and agreed.   Follow Up Instructions: go to ER if problems worsen     I discussed the assessment and treatment plan with the patient. The patient was provided an opportunity to ask questions and all were answered. The patient agreed with the plan and demonstrated an understanding of the instructions.   The patient was advised to call back or seek an in-person evaluation if the symptoms worsen or if the condition fails to improve as anticipated.     Janace Aris, NP  02/08/2019 11:06 AM         Janace Aris, NP 02/10/19 1155

## 2019-02-12 ENCOUNTER — Inpatient Hospital Stay (HOSPITAL_COMMUNITY): Admission: AD | Admit: 2019-02-12 | Payer: 59 | Source: Home / Self Care | Admitting: Obstetrics & Gynecology

## 2019-02-12 ENCOUNTER — Inpatient Hospital Stay (HOSPITAL_COMMUNITY): Payer: 59

## 2019-03-05 ENCOUNTER — Other Ambulatory Visit: Payer: Self-pay

## 2019-03-05 ENCOUNTER — Encounter: Payer: Self-pay | Admitting: Nurse Practitioner

## 2019-03-05 ENCOUNTER — Telehealth (INDEPENDENT_AMBULATORY_CARE_PROVIDER_SITE_OTHER): Payer: 59 | Admitting: Nurse Practitioner

## 2019-03-05 DIAGNOSIS — Z30011 Encounter for initial prescription of contraceptive pills: Secondary | ICD-10-CM

## 2019-03-05 DIAGNOSIS — Z8616 Personal history of COVID-19: Secondary | ICD-10-CM

## 2019-03-05 MED ORDER — NORETHIN ACE-ETH ESTRAD-FE 1.5-30 MG-MCG PO TABS: 1.0000 | ORAL_TABLET | Freq: Every day | ORAL | 2 refills | Status: DC

## 2019-03-05 NOTE — Progress Notes (Signed)
10:07a- Called pt. For My Chart visit, no answer & mailbox is full. Will retry in 10 to 15 minutes.   I connected with@ on 03/05/19 at 10:15 AM EST by: MyChart and verified that I am speaking with the correct person using two identifiers.  Patient is located at home and provider is located at [facility].     The purpose of this virtual visit is to provide medical care while limiting exposure to the novel coronavirus. I discussed the limitations, risks, security and privacy concerns of performing an evaluation and management service by MyChart and the availability of in person appointments. I also discussed with the patient that there may be a patient responsible charge related to this service. By engaging in this virtual visit, you consent to the provision of healthcare.  Additionally, you authorize for your insurance to be billed for the services provided during this visit.  The patient expressed understanding and agreed to proceed.  The following staff members participated in the virtual visit:  Marykay Lex, CMA and Nolene Bernheim, NP  Post Partum Visit Note Subjective:    Ms. Elaine Stewart is a 24 y.o. [phone] female who presents for a postpartum visit. She is 4 weeks postpartum following a spontaneous vaginal delivery. I have fully reviewed the prenatal and intrapartum course. The delivery was at 39/3 gestational weeks. Outcome: spontaneous vaginal delivery. Anesthesia: none. Postpartum course has been good. Baby's course has been good. Baby is feeding by bottle - [employer] Good Start. Bleeding thin lochia. Bowel function is normal. Bladder function is normal. Patient is not sexually active. Contraception method is none. Postpartum depression screening: negative.  Client reports she is fully recovered from Covid that she had in pregnancy.  Also was seen in ER for tooth abscess and she has an appointment with a dentist for tooth extraction.  The following portions of the patient's history were  reviewed and updated as appropriate: allergies, current medications, past family history, past medical history, past social history, past surgical history and problem list.  Review of Systems Pertinent items noted in HPI and remainder of comprehensive ROS otherwise negative.   Objective:  There were no vitals filed for this visit. Self-Obtained       Assessment:    Normal postpartum exam. Pap smear not done at today's visit. Last pap smear 05-23-2016 and results were normal. Next pap due 05-2019.   Plan:    1. Contraception: OCP (estrogen/progesterone) 2. Pills ordered and advised to begin pills and take one by mouth daily. 3. Follow up in: 3 months for pills refill and BP check or as needed.   8  minutes of non-face-to-face time spent with the patient   Henrietta Dine [facility] 03/05/2019 10:19 AM   Nolene Bernheim, RN, MSN, NP-BC Nurse Practitioner, [facility] for [employer], [facility] 03/05/2019 10:36 AM

## 2019-03-05 NOTE — Progress Notes (Signed)
Patient wants BC Pills.

## 2019-03-19 ENCOUNTER — Ambulatory Visit (INDEPENDENT_AMBULATORY_CARE_PROVIDER_SITE_OTHER)
Admission: RE | Admit: 2019-03-19 | Discharge: 2019-03-19 | Disposition: A | Discharge status: Home / Self Care | Payer: 59 | Source: Ambulatory Visit

## 2019-03-19 ENCOUNTER — Other Ambulatory Visit: Payer: Self-pay

## 2019-03-19 ENCOUNTER — Telehealth: Payer: 59

## 2019-03-19 DIAGNOSIS — N898 Other specified noninflammatory disorders of vagina: Secondary | ICD-10-CM | POA: Diagnosis not present

## 2019-03-19 MED ORDER — FLUCONAZOLE 150 MG PO TABS: 150.0000 mg | ORAL_TABLET | Freq: Once | ORAL | 0 refills | Status: AC

## 2019-03-19 NOTE — Discharge Instructions (Addendum)
Diflucan prescribed Follow up in person if symptoms not resolved

## 2019-03-19 NOTE — ED Provider Notes (Signed)
Virtual Visit via Video Note:  Elaine Stewart  initiated request for Telemedicine visit with [facility] team. I connected with Elaine Stewart  on 03/19/2019 at 1:28 PM  for a synchronized telemedicine visit using a video enabled HIPPA compliant telemedicine application. I verified that I am speaking with Elaine Stewart  using two identifiers. Elaine Stewart C Elaine Amore, Elaine Stewart  was physically located in a [facility] Urgent care site and Elaine Stewart was located at a different location.   The limitations of evaluation and management by telemedicine as well as the availability of in-person appointments were discussed. Patient was informed that she  may incur a bill ( including co-pay) for this virtual visit encounter. Elaine Stewart  expressed understanding and gave verbal consent to proceed with virtual visit.     History of Present Illness:Elaine Stewart  is a 24 y.o. female presents for evaluation of yeast infection.  Patient states that over the past couple days she has developed some discharge and irritation.  Feels very similar to prior yeast infection and patient is confident that that is what is causing her symptoms.  She is approximately 1 month postpartum, has not returned to normal menstrual cycles.  She was placed on oral contraceptives, but is interested in switching to alternative birth control.   Allergies  Allergen Reactions  . Penicillins Hives    Has patient had a PCN reaction causing immediate rash, facial/tongue/throat swelling, SOB or lightheadedness with hypotension: Yes Has patient had a PCN reaction causing severe rash involving mucus membranes or skin necrosis: No Has patient had a PCN reaction that required hospitalization: No Has patient had a PCN reaction occurring within the last 10 years: Yes If all of the above answers are "NO", then may proceed with Cephalosporin use.      Past Medical History:  Diagnosis Date  . Acne 09/27/2010  . Arthritis   . Asthma affecting  pregnancy, antepartum 05/07/2014  . BV (bacterial vaginosis) 01/13/2014     Social History   Tobacco Use  . Smoking status: Never Smoker  . Smokeless tobacco: Never Used  Substance Use Topics  . Alcohol use: No  . Drug use: No        Observations/Objective: Physical Exam  Constitutional: She is oriented to person, place, and time and well-developed, well-nourished, and in no distress. No distress.  Pulmonary/Chest: Effort normal. No respiratory distress.  Speaking in full sentences  Neurological: She is alert and oriented to person, place, and time.  Speech clear, face symmetric     Assessment and Plan:    ICD-10-CM   1. Vaginal discharge  N89.8     Empirically treating for yeast with Diflucan x2 tabs.  Feels similar to past yeast vaginitis.  Recommended patient to be seen in person for testing if symptoms not resolving or differing from typical yeast symptoms.  Discussed strict return precautions. Patient verbalized understanding and is agreeable with plan.    Follow Up Instructions:     I discussed the assessment and treatment plan with the patient. The patient was provided an opportunity to ask questions and all were answered. The patient agreed with the plan and demonstrated an understanding of the instructions.   The patient was advised to call back or seek an in-person evaluation if the symptoms worsen or if the condition fails to improve as anticipated.      Elaine Dawes, Elaine Stewart  03/19/2019 1:28 PM         Elaine Dawes, Elaine Stewart 03/19/19  1329  

## 2019-03-27 ENCOUNTER — Encounter (HOSPITAL_BASED_OUTPATIENT_CLINIC_OR_DEPARTMENT_OTHER): Payer: 59

## 2019-06-19 ENCOUNTER — Ambulatory Visit
Admission: RE | Admit: 2019-06-19 | Discharge: 2019-06-19 | Disposition: A | Discharge status: Home / Self Care | Payer: 59 | Source: Ambulatory Visit

## 2019-06-19 ENCOUNTER — Other Ambulatory Visit: Payer: Self-pay

## 2019-06-19 ENCOUNTER — Ambulatory Visit (INDEPENDENT_AMBULATORY_CARE_PROVIDER_SITE_OTHER)
Admission: RE | Admit: 2019-06-19 | Discharge: 2019-06-19 | Disposition: A | Discharge status: Home / Self Care | Payer: 59 | Source: Ambulatory Visit

## 2019-06-19 DIAGNOSIS — K0889 Other specified disorders of teeth and supporting structures: Secondary | ICD-10-CM

## 2019-06-19 MED ORDER — CLINDAMYCIN HCL 300 MG PO CAPS: 300.0000 mg | ORAL_CAPSULE | Freq: Three times a day (TID) | ORAL | 0 refills | Status: DC

## 2019-06-19 MED ORDER — IBUPROFEN 800 MG PO TABS: 800.0000 mg | ORAL_TABLET | Freq: Three times a day (TID) | ORAL | 0 refills | Status: DC | PRN

## 2019-06-19 NOTE — ED Provider Notes (Signed)
Virtual Visit via Video Note:  Elaine Stewart  initiated request for Telemedicine visit with [place] [facility] team. I connected with Elaine Stewart  on 06/19/2019 at 12:58 PM  for a synchronized telemedicine visit using a video enabled HIPPA compliant telemedicine application. I verified that I am speaking with Elaine Stewart  using two identifiers. Elaine Balloon, NP  was physically located in a [facility] Urgent care site and Elaine Stewart was located at a different location.   The limitations of evaluation and management by telemedicine as well as the availability of in-person appointments were discussed. Patient was informed that she  may incur a bill ( including co-pay) for this virtual visit encounter. Elaine Stewart  expressed understanding and gave verbal consent to proceed with virtual visit.     History of Present Illness:Elaine Stewart  is a 24 y.o. female presents for evaluation of right front toothache x 3-4 days.  She reports intermittent swelling of her upper lid due to the toothache; none currently.  She denies fever, chills, difficulty swallowing, or other symptoms.  Treatment attempted at home with ibuprofen.  Patient states she has an appointment with her dentist in 2 weeks to have a root canal on this tooth.  She denies current pregnancy or breastfeeding.      Allergies  Allergen Reactions  . Penicillins Hives    Has patient had a PCN reaction causing immediate rash, facial/tongue/throat swelling, SOB or lightheadedness with hypotension: Yes Has patient had a PCN reaction causing severe rash involving mucus membranes or skin necrosis: No Has patient had a PCN reaction that required hospitalization: No Has patient had a PCN reaction occurring within the last 10 years: Yes If all of the above answers are "NO", then may proceed with Cephalosporin use.      Past Medical History:  Diagnosis Date  . Acne 09/27/2010  . Arthritis   . Asthma affecting pregnancy, antepartum 05/07/2014   . BV (bacterial vaginosis) 01/13/2014     Social History   Tobacco Use  . Smoking status: Never Smoker  . Smokeless tobacco: Never Used  Vaping Use  . Vaping Use: Never used  Substance Use Topics  . Alcohol use: No  . Drug use: No    ROS: as stated in HPI.  All other systems reviewed and negative.       Observations/Objective: Physical Exam  VITALS: Patient denies fever. GENERAL: Alert, appears well and in no acute distress. HEENT: Atraumatic. Oral mucosa appears moist.  No facial swelling noted.  NECK: Normal movements of the head and neck. CARDIOPULMONARY: No increased WOB. Speaking in clear sentences. I:E ratio WNL.  MS: Moves all visible extremities without noticeable abnormality. PSYCH: Pleasant and cooperative, well-groomed. Speech normal rate and rhythm. Affect is appropriate. Insight and judgement are appropriate. Attention is focused, linear, and appropriate.  NEURO: CN grossly intact. Oriented as arrived to appointment on time with no prompting. Moves both UE equally.  SKIN: No obvious lesions, wounds, erythema, or cyanosis noted on face or hands.   Assessment and Plan:    ICD-10-CM   1. Pain, dental  K08.89        Follow Up Instructions: Treating with clindamycin and ibuprofen.  Instructed patient to follow-up with her dentist as scheduled in 2 weeks for her root canal.  Instructed her to come to the urgent care if she has uncontrolled pain or worsening symptoms.  Patient agrees to plan of care.      I discussed the assessment and  treatment plan with the patient. The patient was provided an opportunity to ask questions and all were answered. The patient agreed with the plan and demonstrated an understanding of the instructions.   The patient was advised to call back or seek an in-person evaluation if the symptoms worsen or if the condition fails to improve as anticipated.      Elaine Bail, NP  06/19/2019 12:58 PM         Elaine Bail,  NP 06/19/19 1258

## 2019-06-19 NOTE — Discharge Instructions (Addendum)
Take the clindamycin and ibuprofen as directed.    Follow up with your dentist as scheduled.    Come to the Urgent Care to be seen in person if you have worsening symptoms.

## 2019-06-26 ENCOUNTER — Telehealth: Payer: 59 | Admitting: Family

## 2019-06-26 DIAGNOSIS — B373 Candidiasis of vulva and vagina: Secondary | ICD-10-CM | POA: Diagnosis not present

## 2019-06-26 DIAGNOSIS — B3731 Acute candidiasis of vulva and vagina: Secondary | ICD-10-CM

## 2019-06-26 MED ORDER — FLUCONAZOLE 150 MG PO TABS: 150.0000 mg | ORAL_TABLET | ORAL | 0 refills | Status: DC | PRN

## 2019-06-26 NOTE — Progress Notes (Signed)

## 2019-07-17 ENCOUNTER — Ambulatory Visit (INDEPENDENT_AMBULATORY_CARE_PROVIDER_SITE_OTHER): Payer: 59 | Admitting: Lactation Services

## 2019-07-17 ENCOUNTER — Other Ambulatory Visit: Payer: Self-pay

## 2019-07-17 VITALS — BP 109/71 | Ht 62.5 in | Wt 128.2 lb

## 2019-07-17 DIAGNOSIS — Z3042 Encounter for surveillance of injectable contraceptive: Secondary | ICD-10-CM

## 2019-07-17 DIAGNOSIS — Z30013 Encounter for initial prescription of injectable contraceptive: Secondary | ICD-10-CM

## 2019-07-17 MED ORDER — MEDROXYPROGESTERONE ACETATE 150 MG/ML IM SUSP
150.0000 mg | Freq: Once | INTRAMUSCULAR | Status: AC
  Administered 2019-07-17: 150 mg via INTRAMUSCULAR

## 2019-07-17 NOTE — Progress Notes (Signed)
Patient was assessed and managed by nursing staff during this encounter. I have reviewed the chart and agree with the documentation and plan. I have also made any necessary editorial changes.  Kaylynne Andres A Valorie Mcgrory, MD 07/17/2019 10:19 AM   

## 2019-07-17 NOTE — Progress Notes (Signed)
Elaine Stewart here for Depo-Provera  Injection.  Injection administered without complication. Patient will return in 3 months for next injection. Patient tolerated it well. This is a new start Depo. Patient currently on her cycle and has not had unprotected sex recently.   Ed Blalock, RN 07/17/2019  8:40 AM

## 2019-07-24 ENCOUNTER — Encounter (HOSPITAL_COMMUNITY): Payer: Self-pay

## 2019-07-24 ENCOUNTER — Ambulatory Visit (HOSPITAL_COMMUNITY)
Admission: EM | Admit: 2019-07-24 | Discharge: 2019-07-24 | Disposition: A | Discharge status: Home / Self Care | Payer: 59 | Attending: Urgent Care | Admitting: Urgent Care

## 2019-07-24 ENCOUNTER — Other Ambulatory Visit: Payer: Self-pay

## 2019-07-24 DIAGNOSIS — J351 Hypertrophy of tonsils: Secondary | ICD-10-CM | POA: Diagnosis present

## 2019-07-24 DIAGNOSIS — J029 Acute pharyngitis, unspecified: Secondary | ICD-10-CM | POA: Diagnosis not present

## 2019-07-24 LAB — POCT RAPID STREP A: Streptococcus, Group A Screen (Direct): NEGATIVE

## 2019-07-24 MED ORDER — NAPROXEN 500 MG PO TABS: 500.0000 mg | ORAL_TABLET | Freq: Two times a day (BID) | ORAL | 0 refills | Status: DC

## 2019-07-24 MED ORDER — CETIRIZINE HCL 10 MG PO TABS: 10.0000 mg | ORAL_TABLET | Freq: Every day | ORAL | 0 refills | Status: DC

## 2019-07-24 NOTE — ED Triage Notes (Addendum)
Pt presents with sore throat x 1 day. Worse when swallow. Denies fever.

## 2019-07-24 NOTE — ED Provider Notes (Signed)
[facility]   MRN: [id number] DOB: 02-24-1995  Subjective:   Elaine Stewart is a 24 y.o. female presenting for 1 day history of acute on chronic throat pain.  Patient has a history of persistent strep throat infections as a child.  Also has documented history of hypertrophy of tonsils and adenoids.  She has never gotten a consult for tonsillectomy.  She does not have a PCP.  Denies fever, runny or stuffy nose, ear pain, cough, chest pain, shortness of breath.  Patient has already had COVID-19, states that her current symptoms are very different.  No current facility-administered medications for this encounter.  Current Outpatient Medications:  .  medroxyPROGESTERone (DEPO-PROVERA) 150 MG/ML injection, Inject 150 mg into the muscle every 3 (three) months., Disp: , Rfl:  .  acetaminophen (TYLENOL) 325 MG tablet, Take 2 tablets (650 mg total) by mouth every 6 (six) hours as needed (for pain scale < 4)., Disp: 30 tablet, Rfl: 0 .  clindamycin (CLEOCIN) 300 MG capsule, Take 1 capsule (300 mg total) by mouth 3 (three) times daily., Disp: 21 capsule, Rfl: 0 .  fluconazole (DIFLUCAN) 150 MG tablet, Take 1 tablet (150 mg total) by mouth every three (3) days as needed. (Patient not taking: Reported on 07/17/2019), Disp: 3 tablet, Rfl: 0 .  ibuprofen (ADVIL) 800 MG tablet, Take 1 tablet (800 mg total) by mouth every 8 (eight) hours as needed. (Patient not taking: Reported on 07/17/2019), Disp: 21 tablet, Rfl: 0 .  norethindrone-ethinyl estradiol-iron (JUNEL FE 1.5/30) 1.5-30 MG-MCG tablet, Take 1 tablet by mouth daily., Disp: 1 Package, Rfl: 2 .  prenatal vitamin w/FE, FA (PRENATAL 1 + 1) 27-1 MG TABS tablet, Take 1 tablet by mouth daily at 12 noon., Disp: 30 tablet, Rfl: 11   Allergies  Allergen Reactions  . Penicillins Hives    Has patient had a PCN reaction causing immediate rash, facial/tongue/throat swelling, SOB or lightheadedness with hypotension: Yes Has patient had a PCN reaction causing  severe rash involving mucus membranes or skin necrosis: No Has patient had a PCN reaction that required hospitalization: No Has patient had a PCN reaction occurring within the last 10 years: Yes If all of the above answers are "NO", then may proceed with Cephalosporin use.     Past Medical History:  Diagnosis Date  . Acne 09/27/2010  . Arthritis   . Asthma affecting pregnancy, antepartum 05/07/2014  . BV (bacterial vaginosis) 01/13/2014     Past Surgical History:  Procedure Laterality Date  . NO PAST SURGERIES      Family History  Problem Relation Age of Onset  . Healthy Mother   . Asthma Mother   . Healthy Father   . Diabetes Maternal Grandmother   . Diabetes Maternal Grandfather   . Diabetes Paternal Grandmother   . Diabetes Paternal Grandfather     Social History   Tobacco Use  . Smoking status: Never Smoker  . Smokeless tobacco: Never Used  Vaping Use  . Vaping Use: Never used  Substance Use Topics  . Alcohol use: No  . Drug use: No    ROS   Objective:   Vitals: BP 113/66 (BP Location: Right Arm)   Pulse 89   Temp 98.5 F (36.9 C) (Oral)   Resp 17   SpO2 100%   Physical Exam Constitutional:      General: She is not in acute distress.    Appearance: She is well-developed. She is not ill-appearing.  HENT:     Head:  Normocephalic and atraumatic.     Right Ear: Tympanic membrane and ear canal normal. No drainage or tenderness. No middle ear effusion. Tympanic membrane is not erythematous.     Left Ear: Tympanic membrane and ear canal normal. No drainage or tenderness.  No middle ear effusion. Tympanic membrane is not erythematous.     Nose: No congestion or rhinorrhea.     Mouth/Throat:     Mouth: Mucous membranes are moist. No oral lesions.     Pharynx: No pharyngeal swelling, oropharyngeal exudate, posterior oropharyngeal erythema or uvula swelling.     Tonsils: No tonsillar exudate or tonsillar abscesses. 2+ on the right. 2+ on the left.      Comments: Tonsillar hypertrophy. Eyes:     Extraocular Movements:     Right eye: Normal extraocular motion.     Left eye: Normal extraocular motion.     Conjunctiva/sclera: Conjunctivae normal.     Pupils: Pupils are equal, round, and reactive to light.  Cardiovascular:     Rate and Rhythm: Normal rate.  Pulmonary:     Effort: Pulmonary effort is normal.  Musculoskeletal:     Cervical back: Normal range of motion and neck supple.  Lymphadenopathy:     Cervical: No cervical adenopathy.  Skin:    General: Skin is warm and dry.  Neurological:     General: No focal deficit present.     Mental Status: She is alert and oriented to person, place, and time.  Psychiatric:        Mood and Affect: Mood normal.        Behavior: Behavior normal.       Assessment and Plan :   PDMP not reviewed this encounter.  1. Sore throat   2. Tonsillar hypertrophy     No evidence of acute infection, illness.  Counseled on tonsillar hypertrophy and recommended conservative management with naproxen, Zyrtec.  Provided patient with information to establish care with new PCP.  Also attempted a referral to [facility] and [facility] for a consult. Counseled patient on potential for adverse effects with medications prescribed/recommended today, ER and return-to-clinic precautions discussed, patient verbalized understanding.    Wallis Bamberg, PA-C 07/24/19 1119

## 2019-07-26 LAB — CULTURE, GROUP A STREP (THRC)

## 2019-10-02 ENCOUNTER — Ambulatory Visit (INDEPENDENT_AMBULATORY_CARE_PROVIDER_SITE_OTHER): Payer: 59 | Admitting: Obstetrics & Gynecology

## 2019-10-02 ENCOUNTER — Other Ambulatory Visit (HOSPITAL_COMMUNITY)
Admission: RE | Admit: 2019-10-02 | Discharge: 2019-10-02 | Disposition: A | Discharge status: Home / Self Care | Payer: 59 | Source: Ambulatory Visit | Attending: Family Medicine | Admitting: Family Medicine

## 2019-10-02 ENCOUNTER — Other Ambulatory Visit: Payer: Self-pay

## 2019-10-02 ENCOUNTER — Encounter: Payer: Self-pay | Admitting: Obstetrics & Gynecology

## 2019-10-02 VITALS — BP 120/68 | HR 90 | Ht 63.0 in | Wt 123.0 lb

## 2019-10-02 DIAGNOSIS — Z01419 Encounter for gynecological examination (general) (routine) without abnormal findings: Secondary | ICD-10-CM | POA: Insufficient documentation

## 2019-10-02 DIAGNOSIS — Z3042 Encounter for surveillance of injectable contraceptive: Secondary | ICD-10-CM

## 2019-10-02 MED ORDER — MEDROXYPROGESTERONE ACETATE 150 MG/ML IM SUSP
150.0000 mg | Freq: Once | INTRAMUSCULAR | Status: AC
  Administered 2019-10-02: 150 mg via INTRAMUSCULAR

## 2019-10-02 NOTE — Patient Instructions (Signed)
Place depot medroxyprogesterone acetate injection patient instructions here.

## 2019-10-02 NOTE — Progress Notes (Signed)
GYNECOLOGY ANNUAL PREVENTATIVE CARE ENCOUNTER NOTE  History:     Elaine Stewart is a 24 y.o. G65P2012 female here for a routine annual gynecologic exam.  Current complaints: none, recent delivery 8 months ago.   Denies abnormal vaginal bleeding, discharge, pelvic pain, problems with intercourse or other gynecologic concerns.    Gynecologic History No LMP recorded. Patient has had an injection. Contraception: Depo-Provera injections Last Pap: none. First today   Obstetric History OB History  Gravida Para Term Preterm AB Living  3 2 2  0 1 2  SAB TAB Ectopic Multiple Live Births  1 0 0 0 2    # Outcome Date GA Lbr Len/2nd Weight Sex Delivery Anes PTL Lv  3 Term 02/01/19 [redacted]w[redacted]d 03:59 / 00:09 7 lb 7.5 oz (3.388 kg) M Vag-Spont None  LIV  2 Term 07/18/14 [redacted]w[redacted]d  6 lb 7 oz (2.92 kg) M Vag-Spont None N LIV  1 SAB             Past Medical History:  Diagnosis Date  . Acne 09/27/2010  . Arthritis   . Asthma affecting pregnancy, antepartum 05/07/2014  . BV (bacterial vaginosis) 01/13/2014    Past Surgical History:  Procedure Laterality Date  . NO PAST SURGERIES      Current Outpatient Medications on File Prior to Visit  Medication Sig Dispense Refill  . ibuprofen (ADVIL) 800 MG tablet Take 1 tablet (800 mg total) by mouth every 8 (eight) hours as needed. 21 tablet 0  . medroxyPROGESTERone (DEPO-PROVERA) 150 MG/ML injection Inject 150 mg into the muscle every 3 (three) months.     No current facility-administered medications on file prior to visit.    Allergies  Allergen Reactions  . Penicillins Hives    Has patient had a PCN reaction causing immediate rash, facial/tongue/throat swelling, SOB or lightheadedness with hypotension: Yes Has patient had a PCN reaction causing severe rash involving mucus membranes or skin necrosis: No Has patient had a PCN reaction that required hospitalization: No Has patient had a PCN reaction occurring within the last 10 years: Yes If all of the above  answers are "NO", then may proceed with Cephalosporin use.     Social History:  reports that she has never smoked. She has never used smokeless tobacco. She reports that she does not drink alcohol and does not use drugs.  Family History  Problem Relation Age of Onset  . Healthy Mother   . Asthma Mother   . Healthy Father   . Diabetes Maternal Grandmother   . Diabetes Maternal Grandfather   . Diabetes Paternal Grandmother   . Diabetes Paternal Grandfather     The following portions of the patient's history were reviewed and updated as appropriate: allergies, current medications, past family history, past medical history, past social history, past surgical history and problem list.  Review of Systems Pertinent items noted in HPI and remainder of comprehensive ROS otherwise negative.  Physical Exam:  BP 120/68   Pulse 90   Ht 5\' 3"  (1.6 m)   Wt 123 lb (55.8 kg)   BMI 21.79 kg/m  CONSTITUTIONAL: Well-developed, well-nourished female in no acute distress.  HENT:  Normocephalic, atraumatic, External right and left ear normal. Oropharynx is clear and moist EYES: Conjunctivae and EOM are normal. Pupils are equal, round, and reactive to light. No scleral icterus.  NECK: Normal range of motion, supple, no masses.  Normal thyroid.  SKIN: Skin is warm and dry. No rash noted. Not diaphoretic. No erythema.  No pallor. MUSCULOSKELETAL: Normal range of motion. No tenderness.  No cyanosis, clubbing, or edema.  2+ distal pulses. NEUROLOGIC: Alert and oriented to person, place, and time. Normal reflexes, muscle tone coordination.  PSYCHIATRIC: Normal mood and affect. Normal behavior. Normal judgment and thought content. CARDIOVASCULAR: Normal heart rate noted, regular rhythm RESPIRATORY: Clear to auscultation bilaterally. Effort and breath sounds normal, no problems with respiration noted. BREASTS: Symmetric in size. No masses, tenderness, skin changes, nipple drainage, or lymphadenopathy  bilaterally. Performed in the presence of a chaperone. ABDOMEN: Soft, no distention noted.  No tenderness, rebound or guarding.  PELVIC: Normal appearing external genitalia and urethral meatus; normal appearing vaginal mucosa and cervix.  No abnormal discharge noted.  Pap smear obtained.  Normal uterine size, no other palpable masses, no uterine or adnexal tenderness.  Performed in the presence of a chaperone.   Assessment and Plan:    1. Well woman exam with routine gynecological exam Mother doing well, STD testing with pap - Cytology - PAP( [place]) Depo today.   Will follow up results of pap smear and manage accordingly. Routine preventative health maintenance measures emphasized. Please refer to After Visit Summary for other counseling recommendations.      Raynelle Dick, MD, FACOG Obstetrician & Gynecologist, [facility] for [facility], [facility] Group[facility]ap done, will follow up results and manage accordingly. Mammogram scheduled Routine preventative health maintenance measures emphasized

## 2019-10-06 LAB — CYTOLOGY - PAP
Chlamydia: NEGATIVE
Comment: NEGATIVE
Comment: NEGATIVE
Comment: NORMAL
Diagnosis: HIGH — AB
Neisseria Gonorrhea: NEGATIVE
Trichomonas: NEGATIVE

## 2019-10-07 ENCOUNTER — Telehealth (INDEPENDENT_AMBULATORY_CARE_PROVIDER_SITE_OTHER): Payer: 59 | Admitting: Family Medicine

## 2019-10-07 ENCOUNTER — Telehealth: Payer: Self-pay | Admitting: Lactation Services

## 2019-10-07 DIAGNOSIS — R87613 High grade squamous intraepithelial lesion on cytologic smear of cervix (HGSIL): Secondary | ICD-10-CM

## 2019-10-07 NOTE — Telephone Encounter (Signed)
Returned patients call. She did not answer. Was not able to leave a message as mailbox was full. Will send My chart message.

## 2019-10-07 NOTE — Telephone Encounter (Signed)
Patient want to speak to someone about her test results

## 2019-10-07 NOTE — Telephone Encounter (Signed)
Called patient back and discussed results of PAP and need for Colposcopy. Reviewed how Colposcopy is performed and was needed to verify PAP results and to determine if further treatment is needed. Patients questions answered.   Message to front desk to call patient and schedule for Colposcopy at first available appt.

## 2019-10-07 NOTE — Telephone Encounter (Signed)
-----   Message from Malachy Chamber, MD sent at 10/06/2019  9:24 AM EDT ----- HSIL, will need to schedule colpo asap.

## 2019-10-07 NOTE — Telephone Encounter (Signed)
Addressed in another telephone encounter.

## 2019-11-06 ENCOUNTER — Encounter: Payer: Self-pay | Admitting: Obstetrics & Gynecology

## 2019-11-06 ENCOUNTER — Other Ambulatory Visit (HOSPITAL_COMMUNITY)
Admission: RE | Admit: 2019-11-06 | Discharge: 2019-11-06 | Disposition: A | Discharge status: Home / Self Care | Payer: 59 | Source: Ambulatory Visit | Attending: Obstetrics & Gynecology | Admitting: Obstetrics & Gynecology

## 2019-11-06 ENCOUNTER — Ambulatory Visit (INDEPENDENT_AMBULATORY_CARE_PROVIDER_SITE_OTHER): Payer: 59 | Admitting: Obstetrics & Gynecology

## 2019-11-06 ENCOUNTER — Other Ambulatory Visit: Payer: Self-pay

## 2019-11-06 VITALS — BP 107/70 | HR 86 | Wt 121.7 lb

## 2019-11-06 DIAGNOSIS — Z23 Encounter for immunization: Secondary | ICD-10-CM

## 2019-11-06 DIAGNOSIS — R87613 High grade squamous intraepithelial lesion on cytologic smear of cervix (HGSIL): Secondary | ICD-10-CM | POA: Insufficient documentation

## 2019-11-06 NOTE — Addendum Note (Signed)
Addended by: Raynelle Dick on: 11/06/2019 09:33 AM   Modules accepted: Orders

## 2019-11-06 NOTE — Progress Notes (Addendum)
    GYNECOLOGY CLINIC COLPOSCOPY PROCEDURE NOTE  24 y.o. [id number] here for colposcopy for high-grade squamous intraepithelial neoplasia  (HGSIL-encompassing moderate and severe dysplasia) pap smear on 10/3. Discussed role for HPV in cervical dysplasia, need for surveillance.  Patient given informed consent, signed copy in the chart, time out was performed.  Placed in lithotomy position. Cervix viewed with speculum and colposcope after application of acetic acid.   Colposcopy adequate? Yes  visible lesion(s) at 3 o'clock and acetowhite lesion(s) noted at 3 o'clock; corresponding biopsies obtained.  ECC specimen obtained. All specimens were labeled and sent to pathology.  Patient was given post procedure instructions.  Will follow up pathology and manage accordingly; patient will be contacted with results and recommendations.  Routine preventative health maintenance measures emphasized. Pt started Gardasil today.    Raynelle Dick, MD, FACOG Obstetrician & Gynecologist, [facility] for [facility], [facility]

## 2019-11-06 NOTE — Addendum Note (Signed)
Addended by: Faythe Casa on: 11/06/2019 09:51 AM   Modules accepted: Orders

## 2019-11-08 LAB — SURGICAL PATHOLOGY

## 2019-11-12 ENCOUNTER — Encounter: Payer: Self-pay | Admitting: General Practice

## 2019-12-18 ENCOUNTER — Other Ambulatory Visit: Payer: Self-pay

## 2019-12-18 ENCOUNTER — Ambulatory Visit (INDEPENDENT_AMBULATORY_CARE_PROVIDER_SITE_OTHER): Payer: 59 | Admitting: *Deleted

## 2019-12-18 ENCOUNTER — Encounter: Payer: Self-pay | Admitting: *Deleted

## 2019-12-18 VITALS — BP 113/66 | HR 86 | Ht 63.0 in | Wt 119.7 lb

## 2019-12-18 DIAGNOSIS — Z23 Encounter for immunization: Secondary | ICD-10-CM

## 2019-12-18 DIAGNOSIS — Z8619 Personal history of other infectious and parasitic diseases: Secondary | ICD-10-CM

## 2019-12-18 DIAGNOSIS — Z3042 Encounter for surveillance of injectable contraceptive: Secondary | ICD-10-CM

## 2019-12-18 MED ORDER — MEDROXYPROGESTERONE ACETATE 150 MG/ML IM SUSP
150.0000 mg | Freq: Once | INTRAMUSCULAR | Status: AC
  Administered 2019-12-18: 150 mg via INTRAMUSCULAR

## 2019-12-18 NOTE — Progress Notes (Signed)
Pt presents for 2nd Gardasil injection and Depo Provera. Next depo due 3/2-3/16/22. Last Gardasil injection due after 04/05/20. She expressed concerns for irregular bleeding since her last Depo injection. Bleeding is off and on, usually lasts several days and is mostly dark in color. That was her second injection following delivery of her baby. I advised that we wait to see what response she will have following this injection. If she is still having irregular bleeding, she may contact the office for recommendations or schedule appointment to see a provider. Pt agreed and voiced understanding. Medications administered as scheduled. Pt tolerated well. She will need next Annual exam w/pap in November 2022.

## 2019-12-18 NOTE — Progress Notes (Signed)
Patient was assessed and managed by nursing staff during this encounter. I have reviewed the chart and agree with the documentation and plan. I have also made any necessary editorial changes.  Amiya Escamilla, MD 12/18/2019 12:22 PM 

## 2020-02-06 ENCOUNTER — Ambulatory Visit (INDEPENDENT_AMBULATORY_CARE_PROVIDER_SITE_OTHER): Payer: 59

## 2020-02-06 ENCOUNTER — Encounter (HOSPITAL_COMMUNITY): Payer: Self-pay | Admitting: Emergency Medicine

## 2020-02-06 ENCOUNTER — Ambulatory Visit (HOSPITAL_COMMUNITY)
Admission: EM | Admit: 2020-02-06 | Discharge: 2020-02-06 | Disposition: A | Discharge status: Home / Self Care | Payer: 59 | Attending: Family Medicine | Admitting: Family Medicine

## 2020-02-06 ENCOUNTER — Other Ambulatory Visit: Payer: Self-pay

## 2020-02-06 DIAGNOSIS — S29019A Strain of muscle and tendon of unspecified wall of thorax, initial encounter: Secondary | ICD-10-CM

## 2020-02-06 DIAGNOSIS — S39012A Strain of muscle, fascia and tendon of lower back, initial encounter: Secondary | ICD-10-CM | POA: Diagnosis not present

## 2020-02-06 DIAGNOSIS — M545 Low back pain, unspecified: Secondary | ICD-10-CM | POA: Diagnosis not present

## 2020-02-06 MED ORDER — CYCLOBENZAPRINE HCL 10 MG PO TABS: 10.0000 mg | ORAL_TABLET | Freq: Three times a day (TID) | ORAL | 0 refills | Status: DC | PRN

## 2020-02-06 MED ORDER — IBUPROFEN 800 MG PO TABS: 800.0000 mg | ORAL_TABLET | Freq: Three times a day (TID) | ORAL | 0 refills | Status: DC | PRN
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
## 2020-02-13 ENCOUNTER — Other Ambulatory Visit: Payer: Self-pay | Admitting: Family Medicine

## 2020-02-13 NOTE — Telephone Encounter (Signed)
   Notes to clinic ED pt, no PCP.

## 2020-03-04 ENCOUNTER — Ambulatory Visit: Payer: 59

## 2020-03-19 ENCOUNTER — Emergency Department (HOSPITAL_COMMUNITY): Payer: 59

## 2020-03-19 ENCOUNTER — Emergency Department (HOSPITAL_COMMUNITY)
Admission: EM | Admit: 2020-03-19 | Discharge: 2020-03-19 | Disposition: A | Discharge status: Home / Self Care | Payer: 59 | Attending: Emergency Medicine | Admitting: Emergency Medicine

## 2020-03-19 ENCOUNTER — Encounter (HOSPITAL_COMMUNITY): Payer: Self-pay

## 2020-03-19 DIAGNOSIS — Z8616 Personal history of COVID-19: Secondary | ICD-10-CM | POA: Insufficient documentation

## 2020-03-19 DIAGNOSIS — R0781 Pleurodynia: Secondary | ICD-10-CM | POA: Diagnosis not present

## 2020-03-19 DIAGNOSIS — M25532 Pain in left wrist: Secondary | ICD-10-CM | POA: Diagnosis not present

## 2020-03-19 DIAGNOSIS — J45909 Unspecified asthma, uncomplicated: Secondary | ICD-10-CM | POA: Insufficient documentation

## 2020-03-19 DIAGNOSIS — S70311A Abrasion, right thigh, initial encounter: Secondary | ICD-10-CM | POA: Insufficient documentation

## 2020-03-19 DIAGNOSIS — Y92410 Unspecified street and highway as the place of occurrence of the external cause: Secondary | ICD-10-CM | POA: Diagnosis not present

## 2020-03-19 DIAGNOSIS — M79642 Pain in left hand: Secondary | ICD-10-CM | POA: Diagnosis not present

## 2020-03-19 DIAGNOSIS — S79921A Unspecified injury of right thigh, initial encounter: Secondary | ICD-10-CM | POA: Diagnosis present

## 2020-03-19 MED ORDER — METHYL SALICYLATE-LIDO-MENTHOL 4-4-5 % EX PTCH: 1.0000 | MEDICATED_PATCH | Freq: Two times a day (BID) | CUTANEOUS | 0 refills | Status: DC

## 2020-03-19 MED ORDER — DICLOFENAC EPOLAMINE 1.3 % EX PTCH
1.0000 | MEDICATED_PATCH | Freq: Once | CUTANEOUS | Status: DC
  Administered 2020-03-19: 1 via TRANSDERMAL
  Filled 2020-03-19: qty 1

## 2020-03-19 MED ORDER — ACETAMINOPHEN 500 MG PO TABS
1000.0000 mg | ORAL_TABLET | Freq: Once | ORAL | Status: AC
  Administered 2020-03-19: 1000 mg via ORAL
  Filled 2020-03-19: qty 2

## 2020-03-19 MED ORDER — ACETAMINOPHEN 500 MG PO TABS: 500.0000 mg | ORAL_TABLET | Freq: Four times a day (QID) | ORAL | 0 refills | Status: DC | PRN
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
## 2020-03-19 NOTE — ED Notes (Signed)
Pt d/c by MD and is provided w/ d/c instructions and follow up care, Pt out of the ED ambulatory  
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
## 2020-03-19 NOTE — Progress Notes (Signed)
Orthopedic Tech Progress Note Patient Details:  Elaine Stewart August 08, 1995 [id number]  Ortho Devices Type of Ortho Device: Velcro wrist splint Ortho Device/Splint Location: LUE Ortho Device/Splint Interventions: Ordered,Application,Adjustment   Post Interventions Patient Tolerated: Fair Instructions Provided: Care of device,Adjustment of device,Poper ambulation with device   [name] 03/19/2020, 5:39 PM

## 2020-03-20 ENCOUNTER — Telehealth: Payer: Self-pay | Admitting: *Deleted

## 2020-03-20 NOTE — Telephone Encounter (Signed)
Transition Care Management Unsuccessful Follow-up Telephone Call  Date of discharge and from where:  03/19/2020 - [facility]  Attempts:  1st Attempt  Reason for unsuccessful TCM follow-up call:  Voice mail full

## 2020-03-22 ENCOUNTER — Encounter (HOSPITAL_BASED_OUTPATIENT_CLINIC_OR_DEPARTMENT_OTHER): Payer: Self-pay | Admitting: Obstetrics and Gynecology

## 2020-03-22 ENCOUNTER — Emergency Department (HOSPITAL_BASED_OUTPATIENT_CLINIC_OR_DEPARTMENT_OTHER)
Admission: EM | Admit: 2020-03-22 | Discharge: 2020-03-22 | Disposition: A | Discharge status: Home / Self Care | Payer: 59 | Attending: Emergency Medicine | Admitting: Emergency Medicine

## 2020-03-22 ENCOUNTER — Other Ambulatory Visit: Payer: Self-pay

## 2020-03-22 ENCOUNTER — Emergency Department (HOSPITAL_BASED_OUTPATIENT_CLINIC_OR_DEPARTMENT_OTHER): Payer: 59

## 2020-03-22 DIAGNOSIS — Y92410 Unspecified street and highway as the place of occurrence of the external cause: Secondary | ICD-10-CM | POA: Insufficient documentation

## 2020-03-22 DIAGNOSIS — S3991XA Unspecified injury of abdomen, initial encounter: Secondary | ICD-10-CM | POA: Diagnosis not present

## 2020-03-22 DIAGNOSIS — R10A2 Flank pain, left side: Secondary | ICD-10-CM

## 2020-03-22 DIAGNOSIS — Z8616 Personal history of COVID-19: Secondary | ICD-10-CM | POA: Insufficient documentation

## 2020-03-22 DIAGNOSIS — R0781 Pleurodynia: Secondary | ICD-10-CM | POA: Diagnosis not present

## 2020-03-22 DIAGNOSIS — R109 Unspecified abdominal pain: Secondary | ICD-10-CM

## 2020-03-22 LAB — PREGNANCY, URINE: Preg Test, Ur: NEGATIVE

## 2020-03-22 MED ORDER — MORPHINE SULFATE (PF) 4 MG/ML IV SOLN
4.0000 mg | Freq: Once | INTRAVENOUS | Status: AC
  Administered 2020-03-22: 4 mg via INTRAVENOUS
  Filled 2020-03-22: qty 1

## 2020-03-22 MED ORDER — IOHEXOL 300 MG/ML  SOLN
80.0000 mL | Freq: Once | INTRAMUSCULAR | Status: AC | PRN
  Administered 2020-03-22: 80 mL via INTRAVENOUS

## 2020-03-22 MED ORDER — ONDANSETRON HCL 4 MG/2ML IJ SOLN
4.0000 mg | Freq: Once | INTRAMUSCULAR | Status: AC
  Administered 2020-03-22: 4 mg via INTRAVENOUS
  Filled 2020-03-22: qty 2
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
## 2020-03-22 NOTE — ED Notes (Signed)
ED Provider at bedside. 

## 2020-03-22 NOTE — ED Notes (Signed)
Pt to CT. NAD. 
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
## 2020-03-22 NOTE — Discharge Instructions (Signed)
Take 4 over the counter ibuprofen tablets 3 times a day or 2 over-the-counter naproxen tablets twice a day for pain. Also take tylenol 1000mg (2 extra strength) four times a day.   Use a pillow to hold against your ribs if you are having pain when you try and take a big deep breath.  Please follow-up with your family doctor in about a week to have them recheck you and make sure things are getting better.
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
## 2020-03-23 ENCOUNTER — Telehealth: Payer: Self-pay

## 2020-03-23 NOTE — Telephone Encounter (Signed)
Transition Care Management Follow-up Telephone Call  Date of discharge and from where: 03/19/2020 from [facility]  How have you been since you were released from the hospital? Pt stated that she is still in a lot of pain. Pt is calling to establish with PCP.   Any questions or concerns? No  Items Reviewed:  Did the pt receive and understand the discharge instructions provided? Yes   Medications obtained and verified? Yes   Other? No   Any new allergies since your discharge? No   Dietary orders reviewed? n/a  Do you have support at home? Yes   Functional Questionnaire: (I = Independent and D = Dependent) ADLs: I  Bathing/Dressing- I  Meal Prep- I  Eating- I  Maintaining continence- I  Transferring/Ambulation- I  Managing Meds- I   Follow up appointments reviewed:   Are transportation arrangements needed? No   If their condition worsens, is the pt aware to call PCP or go to the Emergency Dept.? Yes  Was the patient provided with contact information for the PCP's office or ED? Yes  Was to pt encouraged to call back with questions or concerns? Yes

## 2020-03-23 NOTE — Telephone Encounter (Signed)
Transition Care Management Follow-up Telephone Call  Date of discharge and from where: 03/22/2020 from [facility]  How have you been since you were released from the hospital? Pt stated that she is still in a lot of pain. Pt is calling to establish with PCP.   Any questions or concerns? No  Items Reviewed:  Did the pt receive and understand the discharge instructions provided? Yes   Medications obtained and verified? Yes   Other? No   Any new allergies since your discharge? No   Dietary orders reviewed? n/a  Do you have support at home? Yes   Functional Questionnaire: (I = Independent and D = Dependent) ADLs: I  Bathing/Dressing- I  Meal Prep- I  Eating- I  Maintaining continence- I  Transferring/Ambulation- I  Managing Meds- I   Follow up appointments reviewed:   Are transportation arrangements needed? No   If their condition worsens, is the pt aware to call PCP or go to the Emergency Dept.? Yes  Was the patient provided with contact information for the PCP's office or ED? Yes  Was to pt encouraged to call back with questions or concerns? Yes

## 2020-03-26 DIAGNOSIS — M79672 Pain in left foot: Secondary | ICD-10-CM | POA: Diagnosis not present

## 2020-03-26 DIAGNOSIS — M79671 Pain in right foot: Secondary | ICD-10-CM | POA: Diagnosis not present

## 2020-03-27 ENCOUNTER — Ambulatory Visit (HOSPITAL_BASED_OUTPATIENT_CLINIC_OR_DEPARTMENT_OTHER): Payer: 59 | Admitting: Family Medicine

## 2020-03-30 ENCOUNTER — Emergency Department (HOSPITAL_BASED_OUTPATIENT_CLINIC_OR_DEPARTMENT_OTHER): Payer: 59

## 2020-03-30 ENCOUNTER — Ambulatory Visit (INDEPENDENT_AMBULATORY_CARE_PROVIDER_SITE_OTHER): Payer: 59 | Admitting: Family Medicine

## 2020-03-30 ENCOUNTER — Emergency Department (HOSPITAL_BASED_OUTPATIENT_CLINIC_OR_DEPARTMENT_OTHER)
Admission: EM | Admit: 2020-03-30 | Discharge: 2020-03-30 | Disposition: A | Discharge status: Home / Self Care | Payer: 59 | Attending: Emergency Medicine | Admitting: Emergency Medicine

## 2020-03-30 ENCOUNTER — Emergency Department (HOSPITAL_COMMUNITY): Payer: 59

## 2020-03-30 ENCOUNTER — Encounter (HOSPITAL_BASED_OUTPATIENT_CLINIC_OR_DEPARTMENT_OTHER): Payer: Self-pay

## 2020-03-30 ENCOUNTER — Other Ambulatory Visit: Payer: Self-pay

## 2020-03-30 ENCOUNTER — Encounter (HOSPITAL_BASED_OUTPATIENT_CLINIC_OR_DEPARTMENT_OTHER): Payer: Self-pay | Admitting: Family Medicine

## 2020-03-30 VITALS — BP 114/74 | HR 64 | Ht 63.0 in | Wt 111.8 lb

## 2020-03-30 DIAGNOSIS — R319 Hematuria, unspecified: Secondary | ICD-10-CM | POA: Diagnosis not present

## 2020-03-30 DIAGNOSIS — N870 Mild cervical dysplasia: Secondary | ICD-10-CM

## 2020-03-30 DIAGNOSIS — Z8616 Personal history of COVID-19: Secondary | ICD-10-CM | POA: Insufficient documentation

## 2020-03-30 DIAGNOSIS — R109 Unspecified abdominal pain: Secondary | ICD-10-CM | POA: Diagnosis not present

## 2020-03-30 DIAGNOSIS — M79662 Pain in left lower leg: Secondary | ICD-10-CM | POA: Diagnosis not present

## 2020-03-30 DIAGNOSIS — M25532 Pain in left wrist: Secondary | ICD-10-CM | POA: Insufficient documentation

## 2020-03-30 DIAGNOSIS — Z7689 Persons encountering health services in other specified circumstances: Secondary | ICD-10-CM | POA: Diagnosis not present

## 2020-03-30 DIAGNOSIS — R0781 Pleurodynia: Secondary | ICD-10-CM | POA: Insufficient documentation

## 2020-03-30 LAB — CBC WITH DIFFERENTIAL/PLATELET
Abs Immature Granulocytes: 0.02 10*3/uL (ref 0.00–0.07)
Basophils Absolute: 0 10*3/uL (ref 0.0–0.1)
Basophils Relative: 0 %
Eosinophils Absolute: 0 10*3/uL (ref 0.0–0.5)
Eosinophils Relative: 0 %
HCT: 37.1 % (ref 36.0–46.0)
Hemoglobin: 12.9 g/dL (ref 12.0–15.0)
Immature Granulocytes: 0 %
Lymphocytes Relative: 27 %
Lymphs Abs: 2.2 10*3/uL (ref 0.7–4.0)
MCH: 29.7 pg (ref 26.0–34.0)
MCHC: 34.8 g/dL (ref 30.0–36.0)
MCV: 85.5 fL (ref 80.0–100.0)
Monocytes Absolute: 0.4 10*3/uL (ref 0.1–1.0)
Monocytes Relative: 5 %
Neutro Abs: 5.4 10*3/uL (ref 1.7–7.7)
Neutrophils Relative %: 68 %
Platelets: 372 10*3/uL (ref 150–400)
RBC: 4.34 MIL/uL (ref 3.87–5.11)
RDW: 13.3 % (ref 11.5–15.5)
WBC: 8 10*3/uL (ref 4.0–10.5)
nRBC: 0 % (ref 0.0–0.2)

## 2020-03-30 LAB — URINALYSIS, MICROSCOPIC (REFLEX)

## 2020-03-30 LAB — BASIC METABOLIC PANEL
Anion gap: 9 (ref 5–15)
BUN: 12 mg/dL (ref 6–20)
CO2: 22 mmol/L (ref 22–32)
Calcium: 9.4 mg/dL (ref 8.9–10.3)
Chloride: 106 mmol/L (ref 98–111)
Creatinine, Ser: 0.72 mg/dL (ref 0.44–1.00)
GFR, Estimated: >60 mL/min (ref >60)
Glucose, Bld: 91 mg/dL (ref 70–99)
Potassium: 3.6 mmol/L (ref 3.5–5.1)
Sodium: 137 mmol/L (ref 135–145)

## 2020-03-30 LAB — URINALYSIS, ROUTINE W REFLEX MICROSCOPIC
Bilirubin Urine: NEGATIVE
Glucose, UA: NEGATIVE mg/dL
Ketones, ur: NEGATIVE mg/dL
Nitrite: NEGATIVE
Protein, ur: NEGATIVE mg/dL
Specific Gravity, Urine: >1.03 — ABNORMAL HIGH (ref 1.005–1.030)
pH: 5 (ref 5.0–8.0)

## 2020-03-30 LAB — PREGNANCY, URINE: Preg Test, Ur: NEGATIVE

## 2020-03-30 MED ORDER — METHOCARBAMOL 500 MG PO TABS: 500.0000 mg | ORAL_TABLET | Freq: Two times a day (BID) | ORAL | 0 refills | Status: DC

## 2020-03-30 MED ORDER — METHOCARBAMOL 500 MG PO TABS
500.0000 mg | ORAL_TABLET | Freq: Once | ORAL | Status: AC
  Administered 2020-03-30: 500 mg via ORAL
  Filled 2020-03-30: qty 1
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
## 2020-03-30 NOTE — Patient Instructions (Addendum)
   Medication Instructions:  Your physician recommends that you continue on your current medications as directed. Please refer to the Current Medication list given to you today. --If you need a refill on any your medications before your next appointment, please call your pharmacy first. If no refills are authorized on file call the office.--  Procedures/Imaging: A referral has been placed for you to Dr. Hyacinth Meeker to establish care for OB/GYN. Someone from the scheduling department will be in contact with you in regards to coordinating your consultation. If you do not hear from any of the schedulers within 7-10 business days please give our office a call.  Follow-Up: Your next appointment:   Your physician recommends that you schedule a follow-up appointment in: 6 MONTHS with Dr. [name]  Your physician recommends that you schedule a Nurse Visit appointment after 04/03 for your last HPV injection.  Thanks for letting us be apart of your health journey!!  Primary Care and Sports Medicine    Dr. [name] and Shawna Clamp, DNP, AGNP  We recommend signing up for the patient portal called "MyChart".  Sign up information is provided on this After Visit Summary.  MyChart is used to connect with patients for Virtual Visits (Telemedicine).  Patients are able to view lab/test results, encounter notes, upcoming appointments, etc.  Non-urgent messages can be sent to your provider as well.   To learn more about what you can do with MyChart, go to [email or web address].

## 2020-03-30 NOTE — Assessment & Plan Note (Signed)
Found on most recent colposcopy following abnormal Pap smear Will refer to OB/GYN for continued monitoring and for patient to establish care with OB/GYN physician

## 2020-03-30 NOTE — ED Notes (Signed)
Patient transported to CT 

## 2020-03-30 NOTE — Assessment & Plan Note (Signed)
Active problem Continue with Tylenol, ibuprofen as needed to help with pain control Continue follow-up with orthopedic surgeon for management - next appt tomorrow Consider topical treatments to help with rib pain
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
## 2020-03-30 NOTE — Discharge Instructions (Addendum)
Please pick up muscle relaxer and take as prescribed. DO NOT DRIVE WHILE ON THIS MEDICATION AS IT CAN MAKE YOU DROWSY. I would also recommend picking up OTC Voltaren gel to apply to your ribs to help with pain.   Your urine did have some blood in it today. It did not show any signs consistent with a urinary tract infection however we will send it for culture and call you in 2-3 days time if it grows any bacteria.   Please follow up with your PCP regarding your ED visit today and for recheck of the blood in your urine.

## 2020-03-31 DIAGNOSIS — M79642 Pain in left hand: Secondary | ICD-10-CM | POA: Diagnosis not present

## 2020-04-01 LAB — URINE CULTURE: Culture: NO GROWTH

## 2020-04-06 ENCOUNTER — Encounter (HOSPITAL_BASED_OUTPATIENT_CLINIC_OR_DEPARTMENT_OTHER): Payer: Self-pay | Admitting: Family Medicine

## 2020-04-14 ENCOUNTER — Ambulatory Visit (HOSPITAL_BASED_OUTPATIENT_CLINIC_OR_DEPARTMENT_OTHER): Payer: 59 | Admitting: Obstetrics & Gynecology

## 2020-04-14 DIAGNOSIS — M79645 Pain in left finger(s): Secondary | ICD-10-CM | POA: Diagnosis not present

## 2020-04-14 DIAGNOSIS — M79642 Pain in left hand: Secondary | ICD-10-CM | POA: Diagnosis not present

## 2020-04-15 ENCOUNTER — Ambulatory Visit: Payer: 59

## 2020-05-12 ENCOUNTER — Ambulatory Visit (HOSPITAL_BASED_OUTPATIENT_CLINIC_OR_DEPARTMENT_OTHER): Payer: 59 | Admitting: Family Medicine

## 2020-05-19 ENCOUNTER — Other Ambulatory Visit: Payer: Self-pay

## 2020-05-19 ENCOUNTER — Ambulatory Visit (INDEPENDENT_AMBULATORY_CARE_PROVIDER_SITE_OTHER): Payer: 59 | Admitting: Family Medicine

## 2020-05-19 ENCOUNTER — Encounter (HOSPITAL_BASED_OUTPATIENT_CLINIC_OR_DEPARTMENT_OTHER): Payer: Self-pay

## 2020-05-19 DIAGNOSIS — Z0000 Encounter for general adult medical examination without abnormal findings: Secondary | ICD-10-CM
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
## 2020-05-28 ENCOUNTER — Encounter (HOSPITAL_BASED_OUTPATIENT_CLINIC_OR_DEPARTMENT_OTHER): Payer: 59 | Admitting: Family Medicine

## 2020-06-11 DIAGNOSIS — M79642 Pain in left hand: Secondary | ICD-10-CM | POA: Diagnosis not present

## 2020-07-21 DIAGNOSIS — M79642 Pain in left hand: Secondary | ICD-10-CM | POA: Diagnosis not present

## 2020-07-23 ENCOUNTER — Other Ambulatory Visit: Payer: Self-pay

## 2020-07-23 ENCOUNTER — Encounter (HOSPITAL_COMMUNITY): Payer: Self-pay

## 2020-07-23 ENCOUNTER — Ambulatory Visit (HOSPITAL_COMMUNITY)
Admission: EM | Admit: 2020-07-23 | Discharge: 2020-07-23 | Disposition: A | Discharge status: Home / Self Care | Payer: 59 | Attending: Internal Medicine | Admitting: Internal Medicine

## 2020-07-23 DIAGNOSIS — Z113 Encounter for screening for infections with a predominantly sexual mode of transmission: Secondary | ICD-10-CM | POA: Diagnosis not present

## 2020-07-23 DIAGNOSIS — B3731 Acute candidiasis of vulva and vagina: Secondary | ICD-10-CM

## 2020-07-23 DIAGNOSIS — N898 Other specified noninflammatory disorders of vagina: Secondary | ICD-10-CM | POA: Insufficient documentation

## 2020-07-23 DIAGNOSIS — B373 Candidiasis of vulva and vagina: Secondary | ICD-10-CM | POA: Insufficient documentation

## 2020-07-23 DIAGNOSIS — R300 Dysuria: Secondary | ICD-10-CM | POA: Diagnosis not present

## 2020-07-23 LAB — POCT URINALYSIS DIPSTICK, ED / UC
Bilirubin Urine: NEGATIVE
Glucose, UA: NEGATIVE mg/dL
Ketones, ur: NEGATIVE mg/dL
Leukocytes,Ua: NEGATIVE
Nitrite: NEGATIVE
Protein, ur: NEGATIVE mg/dL
Specific Gravity, Urine: >=1.03 (ref 1.005–1.030)
Urobilinogen, UA: 0.2 mg/dL (ref 0.0–1.0)
pH: 5.5 (ref 5.0–8.0)

## 2020-07-23 MED ORDER — FLUCONAZOLE 150 MG PO TABS: 150.0000 mg | ORAL_TABLET | Freq: Once | ORAL | 0 refills | Status: AC
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
## 2020-07-23 NOTE — Discharge Instructions (Addendum)
Please take prescribed Diflucan for suspected yeast infection of the vagina.  STD testing and urine culture are pending.  We will call if these are positive and treat as appropriate.  Please refrain from sexual activity until successful treatment and test results are complete.

## 2020-07-23 NOTE — ED Triage Notes (Signed)
Pt in with c/o vaginal discharge and irritation after finishing 7 day course of antibiotics

## 2020-07-24 LAB — URINE CULTURE: Culture: NO GROWTH

## 2020-07-24 LAB — CERVICOVAGINAL ANCILLARY ONLY
Bacterial Vaginitis (gardnerella): POSITIVE — AB
Candida Glabrata: NEGATIVE
Candida Vaginitis: POSITIVE — AB
Chlamydia: NEGATIVE
Comment: NEGATIVE
Comment: NEGATIVE
Comment: NEGATIVE
Comment: NEGATIVE
Comment: NEGATIVE
Comment: NORMAL
Neisseria Gonorrhea: NEGATIVE
Trichomonas: NEGATIVE

## 2020-07-27 ENCOUNTER — Other Ambulatory Visit (HOSPITAL_COMMUNITY): Payer: Self-pay | Admitting: Internal Medicine

## 2020-07-27 MED ORDER — METRONIDAZOLE 500 MG PO TABS: 500.0000 mg | ORAL_TABLET | Freq: Two times a day (BID) | ORAL | 0 refills | Status: AC

## 2020-07-27 NOTE — Progress Notes (Signed)
Patient already treated with Fluconazole at previous visit for candida vaginitis. Will treat with Metronidazole for 7 days for BV. Patient was notified. Medication sent. All questions answered.

## 2020-08-06 DIAGNOSIS — M79642 Pain in left hand: Secondary | ICD-10-CM | POA: Diagnosis not present

## 2020-08-11 ENCOUNTER — Encounter (HOSPITAL_BASED_OUTPATIENT_CLINIC_OR_DEPARTMENT_OTHER): Payer: Self-pay | Admitting: Family Medicine

## 2020-08-11 ENCOUNTER — Other Ambulatory Visit: Payer: Self-pay

## 2020-08-11 ENCOUNTER — Ambulatory Visit (INDEPENDENT_AMBULATORY_CARE_PROVIDER_SITE_OTHER): Payer: 59 | Admitting: Family Medicine

## 2020-08-11 VITALS — BP 104/66 | HR 110 | Ht 63.0 in | Wt 112.0 lb

## 2020-08-11 DIAGNOSIS — N926 Irregular menstruation, unspecified: Secondary | ICD-10-CM

## 2020-08-11 DIAGNOSIS — R110 Nausea: Secondary | ICD-10-CM

## 2020-08-11 NOTE — Patient Instructions (Signed)
  Medication Instructions:  Your physician recommends that you continue on your current medications as directed. Please refer to the Current Medication list given to you today. --If you need a refill on any your medications before your next appointment, please call your pharmacy first. If no refills are authorized on file call the office.--  Lab Work: Your physician has recommended that you have lab work today: CBC, CMP, and HCG If you have labs (blood work) drawn today and your tests are completely normal, you will receive your results via MyChart message OR a phone call from our staff.  Please ensure you check your voicemail in the event that you authorized detailed messages to be left on a delegated number. If you have any lab test that is abnormal or we need to change your treatment, we will call you to review the results.  Follow-Up: Your next appointment:   Your physician recommends that you schedule a follow-up appointment in: September with Dr. [name]  Thanks for letting us be apart of your health journey!!  Primary Care and Sports Medicine   Dr. Ceasar Mons Peru   We encourage you to activate your patient portal called "MyChart".  Sign up information is provided on this After Visit Summary.  MyChart is used to connect with patients for Virtual Visits (Telemedicine).  Patients are able to view lab/test results, encounter notes, upcoming appointments, etc.  Non-urgent messages can be sent to your provider as well. To learn more about what you can do with MyChart, please visit --  [email or web address].

## 2020-08-11 NOTE — Assessment & Plan Note (Signed)
As per discussion as above Patient currently not utilizing contraceptive method, but remains sexually active Check hCG

## 2020-08-11 NOTE — Assessment & Plan Note (Signed)
Started about 1-1/2 weeks ago, feels that it has been worsening since that time -specifically feels that nausea has become more consistent since it and more recently has "had her curled up in bed all day" Symptoms are most notable in the morning, improves once she starts moving around Denies any associated pain, fevers, night sweats, dysuria, diarrhea.  She has not had any vomiting Feels the bowel movements are normal, will have 1 bowel movement every day or every other day.  No blood in stool Has not tried any treatment She was receiving IM injection for birth control up until about 7 months ago, did not follow-up for her scheduled injection with her OB/GYN.  Reports that her menstrual cycles have been irregular since then.  Does report that she has been sexually active, not using any contraceptive methods at present On exam, patient with normal active bowel sounds, moderate tenderness palpation in epigastric region, diffuse mild tenderness to palpation throughout abdomen, no organomegaly Acute onset of nausea, uncertain etiology Will check labs initially including CBC, CMP, hCG If labs are unremarkable, likely treat symptomatically with Zofran, referral to GI If labs return positive, treat accordingly

## 2020-08-11 NOTE — Progress Notes (Signed)
    Procedures performed today:    None.  Independent interpretation of notes and tests performed by another provider:   None.  Brief History, Exam, Impression, and Recommendations:    BP 104/66   Pulse (!) 110   Ht 5\' 3"  (1.6 m)   Wt 112 lb (50.8 kg)   LMP  (LMP Unknown) Comment: patient is on birth control that makes her have irregular menses  SpO2 97%   BMI 19.84 kg/m   Nausea Started about 1-1/2 weeks ago, feels that it has been worsening since that time -specifically feels that nausea has become more consistent since it and more recently has "had her curled up in bed all day" Symptoms are most notable in the morning, improves once she starts moving around Denies any associated pain, fevers, night sweats, dysuria, diarrhea.  She has not had any vomiting Feels the bowel movements are normal, will have 1 bowel movement every day or every other day.  No blood in stool Has not tried any treatment She was receiving IM injection for birth control up until about 7 months ago, did not follow-up for her scheduled injection with her OB/GYN.  Reports that her menstrual cycles have been irregular since then.  Does report that she has been sexually active, not using any contraceptive methods at present On exam, patient with normal active bowel sounds, moderate tenderness palpation in epigastric region, diffuse mild tenderness to palpation throughout abdomen, no organomegaly Acute onset of nausea, uncertain etiology Will check labs initially including CBC, CMP, hCG If labs are unremarkable, likely treat symptomatically with Zofran, referral to GI If labs return positive, treat accordingly  Irregular menses As per discussion as above Patient currently not utilizing contraceptive method, but remains sexually active Check hCG    ___________________________________________ Caven Perine de , MD, ABFM, CAQSM Primary Care and Sports Medicine [facility]

## 2020-08-12 ENCOUNTER — Telehealth (HOSPITAL_BASED_OUTPATIENT_CLINIC_OR_DEPARTMENT_OTHER): Payer: Self-pay

## 2020-08-12 ENCOUNTER — Encounter (HOSPITAL_BASED_OUTPATIENT_CLINIC_OR_DEPARTMENT_OTHER): Payer: Self-pay

## 2020-08-12 LAB — COMPREHENSIVE METABOLIC PANEL
ALT: 8 IU/L (ref 0–32)
AST: 12 IU/L (ref 0–40)
Albumin/Globulin Ratio: 1.8 (ref 1.2–2.2)
Albumin: 4.2 g/dL (ref 3.9–5.0)
Alkaline Phosphatase: 55 IU/L (ref 44–121)
BUN/Creatinine Ratio: 10 (ref 9–23)
BUN: 7 mg/dL (ref 6–20)
Bilirubin Total: 0.4 mg/dL (ref 0.0–1.2)
CO2: 21 mmol/L (ref 20–29)
Calcium: 9.4 mg/dL (ref 8.7–10.2)
Chloride: 101 mmol/L (ref 96–106)
Creatinine, Ser: 0.68 mg/dL (ref 0.57–1.00)
Globulin, Total: 2.4 g/dL (ref 1.5–4.5)
Glucose: 74 mg/dL (ref 65–99)
Potassium: 3.9 mmol/L (ref 3.5–5.2)
Sodium: 137 mmol/L (ref 134–144)
Total Protein: 6.6 g/dL (ref 6.0–8.5)
eGFR: 124 mL/min/{1.73_m2} (ref >59)

## 2020-08-12 LAB — CBC WITH DIFFERENTIAL/PLATELET
Basophils Absolute: 0 10*3/uL (ref 0.0–0.2)
Basos: 1 %
EOS (ABSOLUTE): 0 10*3/uL (ref 0.0–0.4)
Eos: 1 %
Hematocrit: 35.5 % (ref 34.0–46.6)
Hemoglobin: 12.1 g/dL (ref 11.1–15.9)
Immature Grans (Abs): 0 10*3/uL (ref 0.0–0.1)
Immature Granulocytes: 0 %
Lymphocytes Absolute: 1.8 10*3/uL (ref 0.7–3.1)
Lymphs: 33 %
MCH: 29.7 pg (ref 26.6–33.0)
MCHC: 34.1 g/dL (ref 31.5–35.7)
MCV: 87 fL (ref 79–97)
Monocytes Absolute: 0.5 10*3/uL (ref 0.1–0.9)
Monocytes: 9 %
Neutrophils Absolute: 3.1 10*3/uL (ref 1.4–7.0)
Neutrophils: 56 %
Platelets: 318 10*3/uL (ref 150–450)
RBC: 4.07 x10E6/uL (ref 3.77–5.28)
RDW: 13.3 % (ref 11.7–15.4)
WBC: 5.5 10*3/uL (ref 3.4–10.8)

## 2020-08-12 LAB — HCG, SERUM, QUALITATIVE: hCG,Beta Subunit,Qual,Serum: POSITIVE m[IU]/mL — AB (ref <6)

## 2020-08-12 NOTE — Telephone Encounter (Signed)
-----   Message from Hosie Poisson Peru, MD sent at 08/12/2020  8:32 AM EDT ----- Cliffton Asters blood cell and red blood cell counts are normal.  Electrolytes, kidney function, liver function are all normal.  Pregnancy test did return positive.  Suspect the current issues of nausea are related to pregnancy.  To try to help with symptoms of nausea, recommend eating small amounts of food every 1-2 hours to avoid having an empty or full stomach.  Avoiding spicy, odorous, high-fat, acidic and very sweet foods may also be helpful.  Recommend arranging for [facility].

## 2020-08-12 NOTE — Telephone Encounter (Signed)
Spoke with patient, she is aware and agreeable to results Encouraged patient to reach out to OB to follow up on positive pregnancy test She is agreeable.

## 2020-08-14 DIAGNOSIS — M79642 Pain in left hand: Secondary | ICD-10-CM | POA: Diagnosis not present

## 2020-08-18 ENCOUNTER — Telehealth: Payer: 59

## 2020-08-18 ENCOUNTER — Telehealth: Payer: Self-pay

## 2020-08-18 NOTE — Telephone Encounter (Signed)
Called Pt to start New OB Intake, she advised that she called yesterday & spoke with the front desk to cancel the appointment because she has decided to terminate the pregnancy.

## 2020-08-20 DIAGNOSIS — M79642 Pain in left hand: Secondary | ICD-10-CM | POA: Diagnosis not present

## 2020-09-30 ENCOUNTER — Ambulatory Visit (HOSPITAL_BASED_OUTPATIENT_CLINIC_OR_DEPARTMENT_OTHER): Payer: 59 | Admitting: Family Medicine

## 2020-10-21 ENCOUNTER — Encounter (HOSPITAL_BASED_OUTPATIENT_CLINIC_OR_DEPARTMENT_OTHER): Payer: Self-pay | Admitting: Family Medicine

## 2020-12-12 ENCOUNTER — Ambulatory Visit: Admission: EM | Admit: 2020-12-12 | Discharge: 2020-12-12 | Discharge status: Against Medical Advice | Payer: 59

## 2020-12-12 ENCOUNTER — Other Ambulatory Visit: Payer: Self-pay

## 2020-12-12 NOTE — ED Notes (Signed)
Pt not in waiting area. T/C placed - states left due to wait, and will return tomorrow morning.

## 2020-12-12 NOTE — ED Notes (Signed)
Called from waiting area without response. 

## 2021-01-24 ENCOUNTER — Ambulatory Visit (HOSPITAL_COMMUNITY)
Admission: EM | Admit: 2021-01-24 | Discharge: 2021-01-24 | Disposition: A | Discharge status: Home / Self Care | Payer: 59 | Attending: Nurse Practitioner | Admitting: Nurse Practitioner

## 2021-01-24 ENCOUNTER — Encounter (HOSPITAL_COMMUNITY): Payer: Self-pay | Admitting: Emergency Medicine

## 2021-01-24 ENCOUNTER — Other Ambulatory Visit: Payer: Self-pay

## 2021-01-24 DIAGNOSIS — N760 Acute vaginitis: Secondary | ICD-10-CM | POA: Diagnosis present

## 2021-01-24 LAB — POCT URINALYSIS DIPSTICK, ED / UC
Bilirubin Urine: NEGATIVE
Glucose, UA: NEGATIVE mg/dL
Ketones, ur: NEGATIVE mg/dL
Nitrite: NEGATIVE
Protein, ur: 30 mg/dL — AB
Specific Gravity, Urine: 1.015 (ref 1.005–1.030)
Urobilinogen, UA: 0.2 mg/dL (ref 0.0–1.0)
pH: 7.5 (ref 5.0–8.0)

## 2021-01-24 LAB — POC URINE PREG, ED: Preg Test, Ur: NEGATIVE

## 2021-01-24 MED ORDER — FLUCONAZOLE 150 MG PO TABS: 150.0000 mg | ORAL_TABLET | ORAL | 0 refills | Status: AC

## 2021-01-24 NOTE — ED Triage Notes (Signed)
Pt reports hx vaginal yeast infections. Reports just finished antibiotic and thinks might have another due to having vaginal irration.  Is currently on her cycle but noticed chunks when wipes.

## 2021-01-24 NOTE — Discharge Instructions (Addendum)
Your urinalysis does not show any strong indication of a urinary tract infection.  Since you have been on several antibiotics recently, we will wait to prescribe any further antibiotics at this time. I have sent your urine off for cultures as well as vaginal swab to test for bacterial vaginosis, yeast, gonorrhea, chlamydia or trichomonas.  If your urine cultures show any bacterial growth, you will need to take an additional course of antibiotics to treat that.  For now, a prescription for Diflucan has been provided per your request and reported symptoms.  Please take and finish as prescribed.  You should also finish the last 2 doses of the Flagyl for the treatment of bacterial vaginosis. If the swab done here today still shows bacterial vaginosis, then I would recommend that you be treated again since you are almost finished with the treatment now.  Make sure you drink plenty of fluids and follow-up with your primary care provider or gynecologist as needed.
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
## 2021-01-25 LAB — CERVICOVAGINAL ANCILLARY ONLY
Bacterial Vaginitis (gardnerella): POSITIVE — AB
Candida Glabrata: NEGATIVE
Candida Vaginitis: POSITIVE — AB
Chlamydia: NEGATIVE
Comment: NEGATIVE
Comment: NEGATIVE
Comment: NEGATIVE
Comment: NEGATIVE
Comment: NEGATIVE
Comment: NORMAL
Neisseria Gonorrhea: NEGATIVE
Trichomonas: NEGATIVE

## 2021-01-27 ENCOUNTER — Telehealth (HOSPITAL_COMMUNITY): Payer: Self-pay | Admitting: Emergency Medicine

## 2021-01-27 LAB — URINE CULTURE: Culture: 50000 — AB

## 2021-01-27 MED ORDER — NITROFURANTOIN MONOHYD MACRO 100 MG PO CAPS: 100.0000 mg | ORAL_CAPSULE | Freq: Two times a day (BID) | ORAL | 0 refills | Status: DC

## 2021-03-30 ENCOUNTER — Ambulatory Visit (INDEPENDENT_AMBULATORY_CARE_PROVIDER_SITE_OTHER): Payer: 59 | Admitting: Family Medicine

## 2021-03-30 ENCOUNTER — Encounter (HOSPITAL_BASED_OUTPATIENT_CLINIC_OR_DEPARTMENT_OTHER): Payer: Self-pay | Admitting: Family Medicine

## 2021-03-30 ENCOUNTER — Other Ambulatory Visit: Payer: Self-pay

## 2021-03-30 DIAGNOSIS — T7840XA Allergy, unspecified, initial encounter: Secondary | ICD-10-CM

## 2021-03-30 DIAGNOSIS — Z0000 Encounter for general adult medical examination without abnormal findings: Secondary | ICD-10-CM

## 2021-03-30 NOTE — Progress Notes (Signed)
? ? ?  Procedures performed today:   ? ?None. ? ?Independent interpretation of notes and tests performed by another provider:  ? ?None. ? ?Brief History, Exam, Impression, and Recommendations:   ? ?BP (!) 94/52   Pulse (!) 108   Temp 98.9 ?F (37.2 ?C)   Ht 5' 3.5" (1.613 m)   Wt 115 lb (52.2 kg)   SpO2 98%   BMI 20.05 kg/m?  ? ?Allergic reaction ?Patient reports that she had a recent allergic reaction.  She indicates that she was eating a child house and had some seafood, Caesar salad, potato, and she developed facial itching, reported hives, throat tightness.  She did not have specific dyspnea, chest pain, abdominal pain.  She did schedule this visit for evaluation, but also went to urgent care yesterday.  At that time, she reported that symptoms started about 10 hours prior, she had taken Zyrtec at home as she reports being allergic to Benadryl.  She reports that the hives that were on her face had resolved at that point.  Review of documentation indicates that patient was stable and there were no acute concerns regarding angioedema or breathing issues.  She was treated with Solu-Medrol, recommend to continue with Zyrtec, Pepcid and close follow-up.  She thinks that she might have had issues with seafood in the past.  She currently denies any dyspnea or chest pain.  Generally has been doing well.  Still with some throat itching, occasional globus sensation.  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  She is requesting note to remain home from work tomorrow. ?On exam, lungs clear to auscultation bilaterally, heart with regular rate and rhythm.  Pharynx is patent, no negative aches, no drooling, handling secretions well. ?Patient with possible allergic reaction to food recently, concern for allergy to seafood.  Recommend that she continue with medications including Zyrtec and Pepcid. ?Will refer to allergist for further evaluation ?Did discuss recommendation  for presenting to emergency department should any symptoms recur, particularly if she develops any angioedema, tongue swelling, lip swelling, throat tightness, difficulty breathing, chest pain, abdominal pain as any of these can progress and primary concern is related to blockage of airway.  Patient voiced understanding ? ?Plan for follow-up in about 3 months for CPE, nurse visit for labs 1 week prior ? ? ?___________________________________________ ?Joshva Labreck [name], MD, ABFM, CAQSM ?Primary Care and Sports Medicine ?[facility] ?

## 2021-03-30 NOTE — Patient Instructions (Signed)
Seafood Allergy ?A seafood allergy is an abnormal reaction to fish or shellfish by the body's defense system (immune system). If you are allergic to seafood, your body reacts to fish or shellfish as if it is a dangerous substance. In some cases, a seafood allergy can cause a severe, life-threatening reaction that may make it hard to breathe (anaphylaxis). ?A shellfish allergy is one of the most common types of allergy. Shellfish includes crab, lobster, and shrimp or prawns. A shellfish allergy often does not start until the person is an adult. ?It is less common to have an allergy to finned fish, such as tuna, halibut, or salmon. ?What are the causes? ?A seafood allergy happens when the immune system sees fish or shellfish as harmful and releases proteins (antibodies) to fight it. ?What are the signs or symptoms? ?Symptoms of this condition include: ?Itching or tingling in your mouth. ?Coughing. ?Nasal congestion. ?Sneezing. ?Nausea and vomiting. ?Diarrhea. ?Headaches. ?In people with a severe allergy, a life-threatening reaction can occur called anaphylaxis. Get help right away if you have symptoms of anaphylaxis, such as: ?Feeling warm in the face (flushed). This may include redness. ?Itchy, red, swollen areas of skin (hives). ?Swelling of the eyes, lips, face, mouth, tongue, or throat. ?Difficulty breathing, speaking, or swallowing. ?Making a high-pitched whistling sound when you breathe, most often when you breathe out (wheezing). ?Dizziness, light-headedness, or fainting. ?Pain or cramping in the abdomen. ?How is this diagnosed? ?This condition may be diagnosed based on: ?A physical exam. ?Your medical history. ?Blood tests. ?A skin prick test. For this test, a small amount of a liquid containing an allergy-causing substance is put on your arm. ?A food challenge test. This test involves eating the food that may be causing the allergic response while being monitored for a reaction by your health care  provider. ?How is this treated? ?There is no cure for a seafood allergy. Treatment focuses on preventing exposure to the fish or shellfish you are allergic to and treating reactions if you are exposed to the food. Mild symptoms may not need treatment. ?Severe reactions usually need to be treated at a hospital. Treatment may include: ?Medicines that help: ?Tighten your blood vessels (epinephrine). ?Relieve itching and hives (antihistamines). ?Widen the narrow and tight airways (bronchodilators). ?Reduce swelling (corticosteroids). ?Oxygen therapy to help you breathe. ?IV fluids to keep you hydrated. ?After a severe reaction, you may be given rescue medicines, such as: ?An anaphylaxis kit. ?An epinephrine injection, commonly called an auto-injector "pen" (pre-filled automatic epinephrine injection device). ?Your health care provider may teach you how to use these if you are accidentally exposed to an allergen. ?Follow these instructions at home: ?Eating and drinking ?Do not eat the shellfish or fish that you are allergic to. ?Read food labels carefully. Fish and shellfish can be ingredients in sauces, broths, and other products. ?When you eat out, let your server know that you have a seafood allergy. Ask how foods are prepared. ?Shellfish contributes a variety of nutrients to your diet including zinc, magnesium, copper and protein. However, you can get these nutrients from other foods, such as lean meats, eggs, whole or enriched grain products, seeds, fruits, and vegetables. Talk with your dietitian for a complete list of foods to substitute. ?Read labels carefully. Mollusks such as clam, octopus, oyster, mussels, and scallops are shellfish, however, they are not the most common allergens. Ask your health care provider if these are safe to eat. ?General instructions ?Take over-the-counter and prescription medicines only as told by your  health care provider. ?Wear a medical alert bracelet or necklace that describes  your allergy. ?Carry your anaphylaxis kit or an auto-injector pen with you at all times. Use them as told by your health care provider. ?Make sure that you, your family members, and your employer know: ?The signs of anaphylaxis. ?How to use an anaphylaxis kit. ?How to use an auto-injector pen. ?If you think that you are having an anaphylactic reaction, use your auto-injector pen or anaphylaxis kit. ?Replace your epinephrine immediately after you use your auto-injector pen. This is important if you have another reaction. If possible, carry two epinephrine auto-injector pens. ?Get medical care after you use your auto-injector pen. This is important because you can have a delayed, life-threatening reaction after taking the medicine (rebound anaphylaxis). ?Inform all of your health care providers that you have a seafood allergy. ?Work with your health care providers to make an anaphylaxis plan. Preparation is important. ?Keep all follow-up visits. This is important. ?Contact a health care provider if: ?You have symptoms that do not go away within 2 days. ?You have symptoms that get worse. ?You have new symptoms. ?Get help right away if you have symptoms of anaphylaxis: ?Flushed skin. ?Hives. ?Swelling of the eyes, lips, face, mouth, tongue, or throat. ?Difficulty breathing, speaking, or swallowing. ?Wheezing. ?Dizziness, light-headedness, or fainting. ?Pain or cramping in the abdomen. ?These symptoms may represent a serious problem that is an emergency. Do not wait to see if the symptoms will go away. Use your auto-injector pen or anaphylaxis kit as you have been told. Get medical help right away. Call your local emergency services (911 in the [place] Do not drive yourself to the hospital. ?If you needed to use an auto-injector pen, you need more medical care even if the medicine seems to be helping. This is important because anaphylaxis may happen again within 72 hours. ?Summary ?A seafood allergy is when your body  reacts to fish or shellfish as though they are dangerous substances. ?In some cases, a fish or shellfish allergy can cause a severe, life-threatening reaction that may make it hard to breathe (anaphylaxis). ?There is no cure for food allergies. Treatment focuses on preventing exposure to the food or foods you are allergic to and treating reactions if you are exposed to the food. ?Wear a medical alert bracelet or necklace that describes your allergy. ?Make sure you understand your emergency treatment plan and know how to use the auto-injector pen. ?This information is not intended to replace advice given to you by your health care provider. Make sure you discuss any questions you have with your health care provider. ?Document Revised: 04/07/2020 Document Reviewed: 04/07/2020 ?Elsevier Patient Education ? [place]. ? ?

## 2021-03-30 NOTE — Assessment & Plan Note (Signed)
Patient reports that she had a recent allergic reaction.  She indicates that she was eating a child house and had some seafood, Caesar salad, potato, and she developed facial itching, reported hives, throat tightness.  She did not have specific dyspnea, chest pain, abdominal pain.  She did schedule this visit for evaluation, but also went to urgent care yesterday.  At that time, she reported that symptoms started about 10 hours prior, she had taken Zyrtec at home as she reports being allergic to Benadryl.  She reports that the hives that were on her face had resolved at that point.  Review of documentation indicates that patient was stable and there were no acute concerns regarding angioedema or breathing issues.  She was treated with Solu-Medrol, recommend to continue with Zyrtec, Pepcid and close follow-up.  She thinks that she might have had issues with seafood in the past.  She currently denies any dyspnea or chest pain.  Generally has been doing well.  Still with some throat itching, occasional globus sensation.  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  She is requesting note to remain home from work tomorrow. ?On exam, lungs clear to auscultation bilaterally, heart with regular rate and rhythm.  Pharynx is patent, no negative aches, no drooling, handling secretions well. ?Patient with possible allergic reaction to food recently, concern for allergy to seafood.  Recommend that she continue with medications including Zyrtec and Pepcid. ?Will refer to allergist for further evaluation ?Did discuss recommendation for presenting to emergency department should any symptoms recur, particularly if she develops any angioedema, tongue swelling, lip swelling, throat tightness, difficulty breathing, chest pain, abdominal pain as any of these can progress and primary concern is related to blockage of airway.  Patient voiced understanding ?

## 2021-04-14 ENCOUNTER — Telehealth: Payer: Self-pay

## 2021-04-14 ENCOUNTER — Ambulatory Visit (HOSPITAL_COMMUNITY)
Admission: EM | Admit: 2021-04-14 | Discharge: 2021-04-14 | Disposition: A | Discharge status: Home / Self Care | Payer: 59 | Attending: Nurse Practitioner | Admitting: Nurse Practitioner

## 2021-04-14 ENCOUNTER — Encounter (HOSPITAL_COMMUNITY): Payer: Self-pay | Admitting: *Deleted

## 2021-04-14 ENCOUNTER — Other Ambulatory Visit: Payer: Self-pay

## 2021-04-14 DIAGNOSIS — J020 Streptococcal pharyngitis: Secondary | ICD-10-CM

## 2021-04-14 LAB — POCT RAPID STREP A, ED / UC: Streptococcus, Group A Screen (Direct): POSITIVE — AB

## 2021-04-14 MED ORDER — CEPHALEXIN 500 MG PO CAPS: 500.0000 mg | ORAL_CAPSULE | Freq: Two times a day (BID) | ORAL | 0 refills | Status: AC

## 2021-04-14 MED ORDER — ACETAMINOPHEN 325 MG PO TABS
ORAL_TABLET | ORAL | Status: AC
  Filled 2021-04-14: qty 2

## 2021-04-14 MED ORDER — ACETAMINOPHEN 325 MG PO TABS
650.0000 mg | ORAL_TABLET | Freq: Once | ORAL | Status: AC
  Administered 2021-04-14: 650 mg via ORAL

## 2021-04-14 NOTE — Telephone Encounter (Signed)
Patient called office to reports abnormal bleeding. Pt states she did not have menstrual period following discontinuation until a few months ago. States that for the last few months period has been irregular. Reports most recent period lasted from 03/23/21-04/09/21. Provider appt scheduled for annual visit and to discuss bleeding issues on 05/04/21 ?

## 2021-04-14 NOTE — Discharge Instructions (Addendum)
-   We have given you Tylenol 650 mg today for your headache; please get a bite to eat on your way home ?-The rapid strep throat test today is positive-please take the antibiotic (Keflex) 500 mg twice daily for 10 days and complete the entire treatment ?-Please change your toothbrush after treatment is completed to prevent reinfection ?-Make sure you are drinking plenty of fluids; you can use warm salt water gargles and Chloraseptic spray for the throat pain ?-Seek care if your symptoms worsen or persist despite treatment ? ?
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
## 2021-04-14 NOTE — ED Triage Notes (Signed)
Reports sore throat started yesterday and HA started this AM. ?

## 2021-04-27 DIAGNOSIS — N760 Acute vaginitis: Secondary | ICD-10-CM | POA: Diagnosis not present

## 2021-04-27 DIAGNOSIS — N898 Other specified noninflammatory disorders of vagina: Secondary | ICD-10-CM | POA: Diagnosis not present

## 2021-04-27 DIAGNOSIS — B9689 Other specified bacterial agents as the cause of diseases classified elsewhere: Secondary | ICD-10-CM | POA: Diagnosis not present

## 2021-05-04 ENCOUNTER — Ambulatory Visit: Payer: 59 | Admitting: Obstetrics and Gynecology
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
## 2021-05-26 ENCOUNTER — Ambulatory Visit: Payer: 59 | Admitting: Allergy

## 2021-06-07 ENCOUNTER — Ambulatory Visit: Payer: 59 | Admitting: Obstetrics & Gynecology

## 2021-06-30 ENCOUNTER — Encounter (HOSPITAL_BASED_OUTPATIENT_CLINIC_OR_DEPARTMENT_OTHER): Payer: Self-pay | Admitting: Family Medicine

## 2021-07-22 ENCOUNTER — Encounter (HOSPITAL_BASED_OUTPATIENT_CLINIC_OR_DEPARTMENT_OTHER): Payer: Self-pay | Admitting: Family Medicine

## 2021-07-27 ENCOUNTER — Telehealth (HOSPITAL_BASED_OUTPATIENT_CLINIC_OR_DEPARTMENT_OTHER): Payer: Self-pay | Admitting: Family Medicine

## 2021-07-27 NOTE — Telephone Encounter (Signed)
Pt has hit 3 No Shows as of 06/30/21 appt missed. This has made pt eligible for dismissal. Just seeing if it is okay to proceed with dismissing since this is your pt. Please advise.

## 2021-07-28 ENCOUNTER — Encounter (HOSPITAL_BASED_OUTPATIENT_CLINIC_OR_DEPARTMENT_OTHER): Payer: Self-pay | Admitting: Family Medicine

## 2021-07-28 NOTE — Telephone Encounter (Signed)
Dismissal letter printed on 7/26.

## 2021-07-30 DIAGNOSIS — N898 Other specified noninflammatory disorders of vagina: Secondary | ICD-10-CM | POA: Diagnosis not present

## 2021-09-07 ENCOUNTER — Encounter (HOSPITAL_COMMUNITY): Payer: Self-pay

## 2021-09-07 ENCOUNTER — Ambulatory Visit (HOSPITAL_COMMUNITY)
Admission: RE | Admit: 2021-09-07 | Discharge: 2021-09-07 | Disposition: A | Discharge status: Home / Self Care | Payer: Medicaid Other | Source: Ambulatory Visit | Attending: Family Medicine | Admitting: Family Medicine

## 2021-09-07 VITALS — BP 98/68 | HR 87 | Temp 98.5°F | Resp 18

## 2021-09-07 DIAGNOSIS — N760 Acute vaginitis: Secondary | ICD-10-CM | POA: Diagnosis not present

## 2021-09-07 MED ORDER — FLUCONAZOLE 150 MG PO TABS: 150.0000 mg | ORAL_TABLET | ORAL | 0 refills | Status: AC

## 2021-09-07 NOTE — Discharge Instructions (Addendum)
Take fluconazole 150 mg--1 tablet every 3 days for 2 doses  Staff will call you if there are any positives that need treating on the swab.

## 2021-09-07 NOTE — ED Triage Notes (Signed)
Pt reports white vaginal discharge x 2 days after recent antibiotic use. States believes she has a yeast infection.
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
## 2021-09-08 LAB — CERVICOVAGINAL ANCILLARY ONLY
Bacterial Vaginitis (gardnerella): POSITIVE — AB
Candida Glabrata: NEGATIVE
Candida Vaginitis: POSITIVE — AB
Chlamydia: NEGATIVE
Comment: NEGATIVE
Comment: NEGATIVE
Comment: NEGATIVE
Comment: NEGATIVE
Comment: NEGATIVE
Comment: NORMAL
Neisseria Gonorrhea: NEGATIVE
Trichomonas: NEGATIVE

## 2021-09-09 ENCOUNTER — Telehealth (HOSPITAL_COMMUNITY): Payer: Self-pay | Admitting: Emergency Medicine

## 2021-09-09 MED ORDER — METRONIDAZOLE 500 MG PO TABS: 500.0000 mg | ORAL_TABLET | Freq: Two times a day (BID) | ORAL | 0 refills | Status: AC

## 2021-12-05 DIAGNOSIS — O219 Vomiting of pregnancy, unspecified: Secondary | ICD-10-CM | POA: Diagnosis not present

## 2021-12-20 DIAGNOSIS — Z3481 Encounter for supervision of other normal pregnancy, first trimester: Secondary | ICD-10-CM | POA: Diagnosis not present

## 2021-12-20 DIAGNOSIS — Z3A09 9 weeks gestation of pregnancy: Secondary | ICD-10-CM | POA: Diagnosis not present

## 2021-12-20 DIAGNOSIS — O3680X0 Pregnancy with inconclusive fetal viability, not applicable or unspecified: Secondary | ICD-10-CM | POA: Diagnosis not present

## 2021-12-23 DIAGNOSIS — R0789 Other chest pain: Secondary | ICD-10-CM | POA: Diagnosis not present

## 2021-12-23 DIAGNOSIS — R110 Nausea: Secondary | ICD-10-CM | POA: Diagnosis not present

## 2021-12-23 DIAGNOSIS — R0602 Shortness of breath: Secondary | ICD-10-CM | POA: Diagnosis not present

## 2021-12-23 DIAGNOSIS — Z20822 Contact with and (suspected) exposure to covid-19: Secondary | ICD-10-CM | POA: Diagnosis not present

## 2021-12-23 DIAGNOSIS — R42 Dizziness and giddiness: Secondary | ICD-10-CM | POA: Diagnosis not present

## 2021-12-23 DIAGNOSIS — R072 Precordial pain: Secondary | ICD-10-CM | POA: Diagnosis not present

## 2021-12-23 DIAGNOSIS — R531 Weakness: Secondary | ICD-10-CM | POA: Diagnosis not present

## 2021-12-23 DIAGNOSIS — R079 Chest pain, unspecified: Secondary | ICD-10-CM | POA: Diagnosis not present

## 2021-12-23 DIAGNOSIS — O26899 Other specified pregnancy related conditions, unspecified trimester: Secondary | ICD-10-CM | POA: Diagnosis not present

## 2022-01-11 DIAGNOSIS — N870 Mild cervical dysplasia: Secondary | ICD-10-CM | POA: Diagnosis not present

## 2022-01-11 DIAGNOSIS — O09899 Supervision of other high risk pregnancies, unspecified trimester: Secondary | ICD-10-CM | POA: Diagnosis not present

## 2022-02-21 DIAGNOSIS — N390 Urinary tract infection, site not specified: Secondary | ICD-10-CM | POA: Diagnosis not present

## 2022-02-22 DIAGNOSIS — Z3A18 18 weeks gestation of pregnancy: Secondary | ICD-10-CM | POA: Diagnosis not present

## 2022-02-22 DIAGNOSIS — O321XX0 Maternal care for breech presentation, not applicable or unspecified: Secondary | ICD-10-CM | POA: Diagnosis not present
